# Patient Record
Sex: Female | Born: 1950 | Race: White | Hispanic: No
Health system: Midwestern US, Community
[De-identification: ages and names within clinical notes are randomized; demographics above are authoritative.]

## PROBLEM LIST (undated history)

## (undated) DIAGNOSIS — N816 Rectocele: Principal | ICD-10-CM

## (undated) DIAGNOSIS — N8110 Cystocele, unspecified: Secondary | ICD-10-CM

## (undated) DIAGNOSIS — R413 Other amnesia: Secondary | ICD-10-CM

## (undated) DIAGNOSIS — Z13820 Encounter for screening for osteoporosis: Secondary | ICD-10-CM

## (undated) DIAGNOSIS — N959 Unspecified menopausal and perimenopausal disorder: Secondary | ICD-10-CM

## (undated) DIAGNOSIS — J322 Chronic ethmoidal sinusitis: Secondary | ICD-10-CM

## (undated) DIAGNOSIS — N814 Uterovaginal prolapse, unspecified: Principal | ICD-10-CM

## (undated) DIAGNOSIS — G4700 Insomnia, unspecified: Secondary | ICD-10-CM

## (undated) DIAGNOSIS — Z1159 Encounter for screening for other viral diseases: Secondary | ICD-10-CM

## (undated) DIAGNOSIS — B009 Herpesviral infection, unspecified: Secondary | ICD-10-CM

## (undated) DIAGNOSIS — I10 Essential (primary) hypertension: Secondary | ICD-10-CM

## (undated) DIAGNOSIS — E785 Hyperlipidemia, unspecified: Secondary | ICD-10-CM

## (undated) DIAGNOSIS — K219 Gastro-esophageal reflux disease without esophagitis: Secondary | ICD-10-CM

## (undated) DIAGNOSIS — E669 Obesity, unspecified: Secondary | ICD-10-CM

## (undated) HISTORY — PX: AUGMENTATION MAMMAPLASTY: SUR837

## (undated) HISTORY — DX: Essential (primary) hypertension: I10

## (undated) HISTORY — DX: Obesity, unspecified: E66.9

## (undated) HISTORY — DX: Hyperlipidemia, unspecified: E78.5

## (undated) HISTORY — DX: Gastro-esophageal reflux disease without esophagitis: K21.9

---

## 1985-10-02 HISTORY — PX: APPENDECTOMY: SHX54

## 1998-04-13 ENCOUNTER — Other Ambulatory Visit: Admission: RE | Admit: 1998-04-13 | Discharge: 1998-04-13 | Payer: Self-pay | Admitting: Gynecology

## 2000-04-11 ENCOUNTER — Other Ambulatory Visit: Admission: RE | Admit: 2000-04-11 | Discharge: 2000-04-11 | Payer: Self-pay | Admitting: Gynecology

## 2000-10-02 HISTORY — PX: FRACTURE SURGERY: SHX138

## 2001-05-24 ENCOUNTER — Other Ambulatory Visit: Admission: RE | Admit: 2001-05-24 | Discharge: 2001-05-24 | Payer: Self-pay | Admitting: Gynecology

## 2001-05-24 ENCOUNTER — Encounter: Admission: RE | Admit: 2001-05-24 | Discharge: 2001-05-24 | Payer: Self-pay | Admitting: Gynecology

## 2001-05-24 ENCOUNTER — Encounter: Payer: Self-pay | Admitting: Gynecology

## 2002-04-10 ENCOUNTER — Other Ambulatory Visit: Admission: RE | Admit: 2002-04-10 | Discharge: 2002-04-10 | Payer: Self-pay | Admitting: Gynecology

## 2005-01-06 ENCOUNTER — Other Ambulatory Visit: Admission: RE | Admit: 2005-01-06 | Discharge: 2005-01-06 | Payer: Self-pay | Admitting: Gynecology

## 2005-05-24 ENCOUNTER — Ambulatory Visit: Payer: Self-pay | Admitting: Internal Medicine

## 2006-01-09 ENCOUNTER — Other Ambulatory Visit: Admission: RE | Admit: 2006-01-09 | Discharge: 2006-01-09 | Payer: Self-pay | Admitting: Gynecology

## 2006-01-23 ENCOUNTER — Encounter: Admission: RE | Admit: 2006-01-23 | Discharge: 2006-01-23 | Payer: Self-pay | Admitting: Gynecology

## 2006-07-04 ENCOUNTER — Encounter: Admission: RE | Admit: 2006-07-04 | Discharge: 2006-07-04 | Payer: Self-pay | Admitting: Gynecology

## 2007-05-23 ENCOUNTER — Other Ambulatory Visit: Admission: RE | Admit: 2007-05-23 | Discharge: 2007-05-23 | Payer: Self-pay | Admitting: Gynecology

## 2007-06-06 ENCOUNTER — Encounter: Admission: RE | Admit: 2007-06-06 | Discharge: 2007-06-06 | Payer: Self-pay | Admitting: Gynecology

## 2007-06-12 ENCOUNTER — Encounter: Admission: RE | Admit: 2007-06-12 | Discharge: 2007-06-12 | Payer: Self-pay | Admitting: Gynecology

## 2007-06-12 DIAGNOSIS — E785 Hyperlipidemia, unspecified: Secondary | ICD-10-CM | POA: Insufficient documentation

## 2008-06-24 ENCOUNTER — Ambulatory Visit: Payer: Self-pay | Admitting: Family Medicine

## 2008-08-10 ENCOUNTER — Encounter: Admission: RE | Admit: 2008-08-10 | Discharge: 2008-08-10 | Payer: Self-pay | Admitting: Gynecology

## 2008-08-24 ENCOUNTER — Ambulatory Visit: Payer: Self-pay | Admitting: Family Medicine

## 2008-10-27 ENCOUNTER — Ambulatory Visit: Payer: Self-pay | Admitting: Family Medicine

## 2009-05-24 ENCOUNTER — Ambulatory Visit (HOSPITAL_BASED_OUTPATIENT_CLINIC_OR_DEPARTMENT_OTHER): Admission: RE | Admit: 2009-05-24 | Discharge: 2009-05-24 | Payer: Self-pay | Admitting: Gynecology

## 2009-05-24 ENCOUNTER — Encounter (INDEPENDENT_AMBULATORY_CARE_PROVIDER_SITE_OTHER): Payer: Self-pay | Admitting: Gynecology

## 2009-06-25 ENCOUNTER — Ambulatory Visit: Payer: Self-pay | Admitting: Family Medicine

## 2009-09-27 ENCOUNTER — Encounter: Admission: RE | Admit: 2009-09-27 | Discharge: 2009-09-27 | Payer: Self-pay | Admitting: Gynecology

## 2009-09-27 LAB — HM MAMMOGRAPHY: HM Mammogram: NEGATIVE

## 2010-08-19 ENCOUNTER — Ambulatory Visit: Payer: Self-pay | Admitting: Family Medicine

## 2010-10-19 ENCOUNTER — Ambulatory Visit (HOSPITAL_COMMUNITY)
Admission: RE | Admit: 2010-10-19 | Discharge: 2010-10-19 | Payer: Self-pay | Source: Home / Self Care | Attending: Cardiovascular Disease | Admitting: Cardiovascular Disease

## 2010-10-23 ENCOUNTER — Encounter: Payer: Self-pay | Admitting: Gynecology

## 2010-10-24 ENCOUNTER — Encounter: Payer: Self-pay | Admitting: Gynecology

## 2010-10-27 ENCOUNTER — Ambulatory Visit
Admission: RE | Admit: 2010-10-27 | Discharge: 2010-10-27 | Payer: Self-pay | Source: Home / Self Care | Attending: Family Medicine | Admitting: Family Medicine

## 2010-11-07 ENCOUNTER — Other Ambulatory Visit: Payer: Self-pay | Admitting: Gynecology

## 2010-11-07 DIAGNOSIS — Z1231 Encounter for screening mammogram for malignant neoplasm of breast: Secondary | ICD-10-CM

## 2010-11-14 ENCOUNTER — Ambulatory Visit: Payer: Self-pay

## 2010-11-15 ENCOUNTER — Other Ambulatory Visit: Payer: Self-pay | Admitting: Gynecology

## 2011-01-07 LAB — POCT HEMOGLOBIN-HEMACUE: Hemoglobin: 12.7 g/dL (ref 12.0–15.0)

## 2011-01-19 ENCOUNTER — Ambulatory Visit (INDEPENDENT_AMBULATORY_CARE_PROVIDER_SITE_OTHER): Payer: BC Managed Care – PPO | Admitting: Family Medicine

## 2011-01-19 DIAGNOSIS — H8110 Benign paroxysmal vertigo, unspecified ear: Secondary | ICD-10-CM

## 2011-02-14 NOTE — Op Note (Signed)
Stacey Irwin, Stacey Irwin               ACCOUNT NO.:  [id number]   MEDICAL RECORD NO.:  [id number]          PATIENT TYPE:  AMB   LOCATION:  [facility]                         FACILITY:  [facility]   PHYSICIAN:  Gretta Cool, M.D. DATE OF BIRTH:  December 08, 1950   DATE OF PROCEDURE:  05/24/2009  DATE OF DISCHARGE:                               OPERATIVE REPORT   PREOPERATIVE DIAGNOSIS:  1. 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  2. Complete septate uterus mullerian fusion abnormality.   PROCEDURE:  Hysteroscopy and partial resection of uterine septum, total  endometrial resection from both uterine horns plus VaporTrode ablation.   SURGEON:  Beather Arbour, M.D.   ANESTHESIA:  IV sedation and paracervical block.   DESCRIPTION OF PROCEDURE:  Under excellent anesthesia as above with the  patient prepped and draped in Allen stirrups, her cervix grasped was  grasped with single-tooth tenaculum and pulled down into view.  It is  then progressively anesthetized with lidocaine 1% for a paracervical  block.  After paracervical block, the entire endometrial cavity was  examined.  Complete septation of the uterus was noted with a very thick  wall septum.  The lower portion of the septum was resected a bit  transversely so as to enter the endometrial cavity on both sides.  There  were narrow openings into the endometrial cavity with difficulty with  visualization because of poor inflow and outflow of fluid into the  hysteroscope.  Once the cavities were entered, visualization was quite  adequate.  On the right cavity, there was a large amount of endometrial  tissue polypoid looking in nature.  In the left there was a uterine  synechiae that partially blocked the examination of the cavity.  There  was resected and then the entire endometrial cavity on the left side  resected as well.  VaporTrode electrode was then used to totally  eliminate any  surface endometrium that might be viable.  There were some  areas on the right where there appeared to be deep extension of  endometrium out into the myometrial tissue.  At the end of the  procedure, the cavity was felt to have been completely treated on both  sides and down the septum.  At this point the procedure was terminated  with minimal bleeding.  Toradol was given intravenously for postop  comfort.  At this point the patient returned to recovery room in  excellent condition.           ______________________________  Gretta Cool, M.D.     CWL/MEDQ  D:  05/24/2009  T:  05/24/2009  Job:  [id number]   cc:   Sharlot Gowda, M.D.  Fax: [phone]

## 2011-03-21 ENCOUNTER — Other Ambulatory Visit: Payer: Self-pay | Admitting: Family Medicine

## 2011-07-20 ENCOUNTER — Encounter: Payer: Self-pay | Admitting: Family Medicine

## 2011-08-27 ENCOUNTER — Other Ambulatory Visit: Payer: Self-pay | Admitting: Family Medicine

## 2011-09-18 ENCOUNTER — Encounter: Payer: BC Managed Care – PPO | Admitting: Family Medicine

## 2011-10-02 ENCOUNTER — Other Ambulatory Visit: Payer: Self-pay | Admitting: Family Medicine

## 2011-10-10 ENCOUNTER — Ambulatory Visit
Admission: RE | Admit: 2011-10-10 | Discharge: 2011-10-10 | Disposition: A | Discharge status: Home / Self Care | Payer: BC Managed Care – PPO | Source: Ambulatory Visit | Attending: Gynecology | Admitting: Gynecology

## 2011-10-10 ENCOUNTER — Encounter: Payer: Self-pay | Admitting: Family Medicine

## 2011-10-10 ENCOUNTER — Ambulatory Visit (INDEPENDENT_AMBULATORY_CARE_PROVIDER_SITE_OTHER): Payer: BC Managed Care – PPO | Admitting: Family Medicine

## 2011-10-10 VITALS — BP 128/80 | HR 57 | Ht 67.0 in | Wt 209.0 lb

## 2011-10-10 DIAGNOSIS — Z1231 Encounter for screening mammogram for malignant neoplasm of breast: Secondary | ICD-10-CM

## 2011-10-10 DIAGNOSIS — Z23 Encounter for immunization: Secondary | ICD-10-CM

## 2011-10-10 DIAGNOSIS — R319 Hematuria, unspecified: Secondary | ICD-10-CM

## 2011-10-10 DIAGNOSIS — E669 Obesity, unspecified: Secondary | ICD-10-CM | POA: Insufficient documentation

## 2011-10-10 DIAGNOSIS — Z0000 Encounter for general adult medical examination without abnormal findings: Secondary | ICD-10-CM

## 2011-10-10 DIAGNOSIS — R05 Cough: Secondary | ICD-10-CM

## 2011-10-10 DIAGNOSIS — E785 Hyperlipidemia, unspecified: Secondary | ICD-10-CM

## 2011-10-10 DIAGNOSIS — I10 Essential (primary) hypertension: Secondary | ICD-10-CM | POA: Insufficient documentation

## 2011-10-10 DIAGNOSIS — R059 Cough, unspecified: Secondary | ICD-10-CM

## 2011-10-10 DIAGNOSIS — Z8249 Family history of ischemic heart disease and other diseases of the circulatory system: Secondary | ICD-10-CM | POA: Insufficient documentation

## 2011-10-10 LAB — CBC WITH DIFFERENTIAL/PLATELET
Basophils Absolute: 0 10*3/uL (ref 0.0–0.1)
Basophils Relative: 1 % (ref 0–1)
Eosinophils Absolute: 0.3 10*3/uL (ref 0.0–0.7)
Eosinophils Relative: 4 % (ref 0–5)
HCT: 42.4 % (ref 36.0–46.0)
Hemoglobin: 13.8 g/dL (ref 12.0–15.0)
Lymphocytes Relative: 29 % (ref 12–46)
Lymphs Abs: 2 10*3/uL (ref 0.7–4.0)
MCH: 30.9 pg (ref 26.0–34.0)
MCHC: 32.5 g/dL (ref 30.0–36.0)
MCV: 95.1 fL (ref 78.0–100.0)
Monocytes Absolute: 0.6 10*3/uL (ref 0.1–1.0)
Monocytes Relative: 9 % (ref 3–12)
Neutro Abs: 4 10*3/uL (ref 1.7–7.7)
Neutrophils Relative %: 57 % (ref 43–77)
Platelets: 263 10*3/uL (ref 150–400)
RBC: 4.46 MIL/uL (ref 3.87–5.11)
RDW: 13.7 % (ref 11.5–15.5)
WBC: 7 10*3/uL (ref 4.0–10.5)

## 2011-10-10 LAB — COMPREHENSIVE METABOLIC PANEL
ALT: 18 U/L (ref 0–35)
AST: 20 U/L (ref 0–37)
Albumin: 4.6 g/dL (ref 3.5–5.2)
Alkaline Phosphatase: 51 U/L (ref 39–117)
BUN: 15 mg/dL (ref 6–23)
CO2: 25 mEq/L (ref 19–32)
Calcium: 9.5 mg/dL (ref 8.4–10.5)
Chloride: 104 mEq/L (ref 96–112)
Creat: 0.95 mg/dL (ref 0.50–1.10)
Glucose, Bld: 87 mg/dL (ref 70–99)
Potassium: 4.2 mEq/L (ref 3.5–5.3)
Sodium: 140 mEq/L (ref 135–145)
Total Bilirubin: 0.4 mg/dL (ref 0.3–1.2)
Total Protein: 7.1 g/dL (ref 6.0–8.3)

## 2011-10-10 LAB — LIPID PANEL
Cholesterol: 226 mg/dL — ABNORMAL HIGH (ref 0–200)
HDL: 58 mg/dL (ref >39)
LDL Cholesterol: 138 mg/dL — ABNORMAL HIGH (ref 0–99)
Total CHOL/HDL Ratio: 3.9 Ratio
Triglycerides: 150 mg/dL — ABNORMAL HIGH (ref <150)
VLDL: 30 mg/dL (ref 0–40)

## 2011-10-10 LAB — POCT URINALYSIS DIPSTICK
Bilirubin, UA: NEGATIVE
Blood, UA: 50
Glucose, UA: NEGATIVE
Ketones, UA: NEGATIVE
Nitrite, UA: NEGATIVE
Protein, UA: NEGATIVE
Spec Grav, UA: 1.02
Urobilinogen, UA: NEGATIVE
pH, UA: 6

## 2011-10-10 LAB — POCT INFLUENZA A/B
Influenza A, POC: NEGATIVE
Influenza B, POC: NEGATIVE

## 2011-10-10 MED ORDER — LISINOPRIL-HYDROCHLOROTHIAZIDE 20-12.5 MG PO TABS: 1.0000 | ORAL_TABLET | Freq: Every day | ORAL | Status: DC

## 2011-10-10 MED ORDER — SIMVASTATIN 20 MG PO TABS: 20.0000 mg | ORAL_TABLET | Freq: Every day | ORAL | Status: DC

## 2011-10-10 NOTE — Patient Instructions (Signed)
See if you can get away with stopping the pantoprazole

## 2011-10-10 NOTE — Progress Notes (Signed)
Subjective:    Patient ID: Stacey Irwin, female    DOB: 10-30-1950, 61 y.o.   MRN: [id number]  HPI She is here for complete examination. She does have a several day history of chest congestion, slight sore throat, malaise. She also has had some difficulty with nocturia and urgency but says she has no difficulty with urinating during the day. She has also noted some knee and hip discomfort. Presently she is on protonix but apparently not using this regularly. She does use Xanax once or twice per week to help with sleep. She is a family history of heart disease, her father died at age 42 of an MI. She has been evaluated in the past by cardiology Her youngest daughter is about to graduate and move away from home. Family and social history was reviewed. She does see her gynecologist regularly.   Review of Systems  Constitutional: Negative.   HENT: Negative.   Eyes: Negative.   Respiratory: Negative.   Cardiovascular: Negative.   Gastrointestinal: Negative.   Genitourinary: Negative.   Musculoskeletal: Negative.   Skin: Negative.   Neurological: Negative.   Psychiatric/Behavioral: Negative.        Objective:   Physical Exam BP 128/80  Pulse 57  Ht 5\' 7"  (1.702 m)  Wt 209 lb (94.802 kg)  BMI 32.73 kg/m2  General Appearance:    Alert, cooperative, no distress, appears stated age  Head:    Normocephalic, without obvious abnormality, atraumatic  Eyes:    PERRL, conjunctiva/corneas clear, EOM's intact, fundi    benign  Ears:    Normal TM's and external ear canals  Nose:   Nares normal, mucosa normal, no drainage or sinus   tenderness  Throat:   Lips, mucosa, and tongue normal; teeth and gums normal  Neck:   Supple, no lymphadenopathy;  thyroid:  no   enlargement/tenderness/nodules; no carotid   bruit or JVD  Back:    Spine nontender, no curvature, ROM normal, no CVA     tenderness  Lungs:     Clear to auscultation bilaterally without wheezes, rales or     ronchi; respirations  unlabored  Chest Wall:    No tenderness or deformity   Heart:    Regular rate and rhythm, S1 and S2 normal, no murmur, rub   or gallop  Breast Exam:    Deferred to GYN  Abdomen:     Soft, non-tender, nondistended, normoactive bowel sounds,    no masses, no hepatosplenomegaly  Genitalia:    Deferred to GYN     Extremities:   No clubbing, cyanosis or edema  Pulses:   2+ and symmetric all extremities  Skin:   Skin color, texture, turgor normal, no rashes or lesions  Lymph nodes:   Cervical, supraclavicular, and axillary nodes normal  Neurologic:   CNII-XII intact, normal strength, sensation and gait; reflexes 2+ and symmetric throughout          Psych:   Normal mood, affect, hygiene and grooming.    Urine microscopic was contaminated.       Assessment & Plan:   1. Cough  Flu vaccine greater than or equal to 3yo preservative free IM, HM COLONOSCOPY, POCT Influenza A/B  2. Family history of heart disease in female family member before age 20  CBC with Differential, Comprehensive metabolic panel, Lipid panel  3. Hyperlipidemia LDL goal < 100  Lipid panel  4. Obesity (BMI 30-39.9)    5. Hypertension  CBC with Differential, Comprehensive metabolic panel, Lipid panel  6. Routine general medical examination at a health care facility  CBC with Differential, Comprehensive metabolic panel, Lipid panel  7. Hematuria  POCT Urinalysis Dipstick   I will have her hold Protonix and only use it if she does note return of reflux type symptoms. She we'll probably see her gynecologist soon. Recommend she discuss the urinary symptoms with him. Continue present medications.

## 2012-02-09 ENCOUNTER — Other Ambulatory Visit: Payer: Self-pay | Admitting: Gynecology

## 2012-03-13 ENCOUNTER — Telehealth: Payer: Self-pay | Admitting: Family Medicine

## 2012-03-14 MED ORDER — PANTOPRAZOLE SODIUM 40 MG PO TBEC: 40.0000 mg | DELAYED_RELEASE_TABLET | Freq: Every day | ORAL | Status: DC

## 2012-03-14 NOTE — Telephone Encounter (Signed)
Protonix called in for reflux symptoms

## 2012-06-04 ENCOUNTER — Ambulatory Visit (INDEPENDENT_AMBULATORY_CARE_PROVIDER_SITE_OTHER): Payer: BC Managed Care – PPO | Admitting: Family Medicine

## 2012-06-04 ENCOUNTER — Encounter: Payer: Self-pay | Admitting: Family Medicine

## 2012-06-04 VITALS — BP 120/70 | HR 68 | Temp 97.6°F | Wt 205.0 lb

## 2012-06-04 DIAGNOSIS — H6690 Otitis media, unspecified, unspecified ear: Secondary | ICD-10-CM

## 2012-06-04 DIAGNOSIS — H6693 Otitis media, unspecified, bilateral: Secondary | ICD-10-CM

## 2012-06-04 DIAGNOSIS — J029 Acute pharyngitis, unspecified: Secondary | ICD-10-CM

## 2012-06-04 LAB — POCT RAPID STREP A (OFFICE): Rapid Strep A Screen: NEGATIVE

## 2012-06-04 MED ORDER — AZITHROMYCIN 500 MG PO TABS: 500.0000 mg | ORAL_TABLET | Freq: Every day | ORAL | Status: AC

## 2012-06-04 NOTE — Patient Instructions (Addendum)
If you're not totally back to normal after one week call me. Use Afrin nasal spray at night

## 2012-06-04 NOTE — Progress Notes (Signed)
  Subjective:    Patient ID: Stacey Irwin, female    DOB: 1951/01/09, 61 y.o.   MRN: [id number]  HPI She a 5 day history of sore throat,headache followed by bilateral earache ,fever cough,congestion,myalgias anorexia. She does not smoke and does not have allergies. She continues on other medications listed in the chart. She cannot take any time off from work due to her busy work load.   Review of Systems     Objective:   Physical Exam alert and in no distress. Tympanic membranes are both dull and vascular, canals are normal. Throat is clear. Tonsils are normal. Neck is supple without adenopathy or thyromegaly. Cardiac exam shows a regular sinus rhythm without murmurs or gallops. Lungs are clear to auscultation.        Assessment & Plan:   1. Sore throat  POCT rapid strep A  2. Bilateral otitis media  azithromycin (ZITHROMAX) 500 MG tablet  If you're not totally back to normal after one week call me. Use Afrin nasal spray at night

## 2012-08-06 ENCOUNTER — Encounter: Payer: Self-pay | Admitting: Internal Medicine

## 2012-08-06 LAB — HM COLONOSCOPY

## 2012-08-14 ENCOUNTER — Encounter: Payer: Self-pay | Admitting: Family Medicine

## 2012-10-09 ENCOUNTER — Other Ambulatory Visit: Payer: Self-pay | Admitting: Family Medicine

## 2012-10-09 DIAGNOSIS — Z1231 Encounter for screening mammogram for malignant neoplasm of breast: Secondary | ICD-10-CM

## 2012-10-09 DIAGNOSIS — Z9882 Breast implant status: Secondary | ICD-10-CM

## 2012-11-14 ENCOUNTER — Encounter: Payer: BC Managed Care – PPO | Admitting: Family Medicine

## 2012-11-15 ENCOUNTER — Encounter: Payer: BC Managed Care – PPO | Admitting: Family Medicine

## 2012-11-15 ENCOUNTER — Encounter: Payer: Self-pay | Admitting: Family Medicine

## 2012-11-15 ENCOUNTER — Ambulatory Visit (INDEPENDENT_AMBULATORY_CARE_PROVIDER_SITE_OTHER): Payer: BC Managed Care – PPO | Admitting: Family Medicine

## 2012-11-15 ENCOUNTER — Other Ambulatory Visit: Payer: Self-pay | Admitting: Family Medicine

## 2012-11-15 VITALS — BP 128/82 | HR 70 | Ht 65.0 in | Wt 205.0 lb

## 2012-11-15 DIAGNOSIS — I10 Essential (primary) hypertension: Secondary | ICD-10-CM

## 2012-11-15 DIAGNOSIS — E669 Obesity, unspecified: Secondary | ICD-10-CM

## 2012-11-15 DIAGNOSIS — Z0000 Encounter for general adult medical examination without abnormal findings: Secondary | ICD-10-CM

## 2012-11-15 DIAGNOSIS — Z79899 Other long term (current) drug therapy: Secondary | ICD-10-CM

## 2012-11-15 DIAGNOSIS — E785 Hyperlipidemia, unspecified: Secondary | ICD-10-CM

## 2012-11-15 LAB — CBC WITH DIFFERENTIAL/PLATELET
Basophils Absolute: 0.1 10*3/uL (ref 0.0–0.1)
Basophils Relative: 1 % (ref 0–1)
Eosinophils Absolute: 0.2 10*3/uL (ref 0.0–0.7)
Eosinophils Relative: 3 % (ref 0–5)
HCT: 38.5 % (ref 36.0–46.0)
Hemoglobin: 13.3 g/dL (ref 12.0–15.0)
Lymphocytes Relative: 36 % (ref 12–46)
Lymphs Abs: 2.1 10*3/uL (ref 0.7–4.0)
MCH: 31.1 pg (ref 26.0–34.0)
MCHC: 34.5 g/dL (ref 30.0–36.0)
MCV: 90.2 fL (ref 78.0–100.0)
Monocytes Absolute: 0.4 10*3/uL (ref 0.1–1.0)
Monocytes Relative: 7 % (ref 3–12)
Neutro Abs: 3.1 10*3/uL (ref 1.7–7.7)
Neutrophils Relative %: 53 % (ref 43–77)
Platelets: 263 10*3/uL (ref 150–400)
RBC: 4.27 MIL/uL (ref 3.87–5.11)
RDW: 13.3 % (ref 11.5–15.5)
WBC: 5.9 10*3/uL (ref 4.0–10.5)

## 2012-11-15 LAB — POCT URINALYSIS DIPSTICK
Bilirubin, UA: NEGATIVE
Blood, UA: NEGATIVE
Glucose, UA: NEGATIVE
Ketones, UA: NEGATIVE
Leukocytes, UA: NEGATIVE
Nitrite, UA: NEGATIVE
Protein, UA: NEGATIVE
Spec Grav, UA: 1.02
Urobilinogen, UA: NEGATIVE
pH, UA: 5

## 2012-11-15 LAB — COMPREHENSIVE METABOLIC PANEL
ALT: 19 U/L (ref 0–35)
AST: 20 U/L (ref 0–37)
Albumin: 4.7 g/dL (ref 3.5–5.2)
Alkaline Phosphatase: 45 U/L (ref 39–117)
BUN: 30 mg/dL — ABNORMAL HIGH (ref 6–23)
CO2: 24 mEq/L (ref 19–32)
Calcium: 9.5 mg/dL (ref 8.4–10.5)
Chloride: 105 mEq/L (ref 96–112)
Creat: 1.16 mg/dL — ABNORMAL HIGH (ref 0.50–1.10)
Glucose, Bld: 93 mg/dL (ref 70–99)
Potassium: 4.6 mEq/L (ref 3.5–5.3)
Sodium: 140 mEq/L (ref 135–145)
Total Bilirubin: 0.5 mg/dL (ref 0.3–1.2)
Total Protein: 7.1 g/dL (ref 6.0–8.3)

## 2012-11-15 LAB — LIPID PANEL
Cholesterol: 215 mg/dL — ABNORMAL HIGH (ref 0–200)
HDL: 62 mg/dL (ref >39)
LDL Cholesterol: 136 mg/dL — ABNORMAL HIGH (ref 0–99)
Total CHOL/HDL Ratio: 3.5 Ratio
Triglycerides: 84 mg/dL (ref <150)
VLDL: 17 mg/dL (ref 0–40)

## 2012-11-15 NOTE — Patient Instructions (Signed)

## 2012-11-15 NOTE — Progress Notes (Signed)
  Subjective:    Patient ID: Stacey Irwin, female    DOB: [birth year], 62 y.o.   MRN: [id number]  HPI She is here for a complete examination. Review of her record indicates she is up-to-date on her shots, has had a colonoscopy as well as Pap. She plans to have another mammogram. She has had difficulty with sleep and does occasionally use Xanax to help with sleep. She cites several fractures including work stress and home stress especially dealing with her.her who is now in college. She continues on medications listed in the chart. Otherwise she has no particular concerns or complaints.   Review of Systems  Constitutional: Negative.   HENT: Negative.   Eyes: Negative.   Respiratory: Negative.   Cardiovascular: Negative.   Gastrointestinal: Negative.   Genitourinary: Negative.   Musculoskeletal: Negative.   Skin: Negative.   Allergic/Immunologic: Negative.   Neurological: Negative.   Hematological: Negative.   Psychiatric/Behavioral: Negative.        Objective:   Physical Exam        Assessment & Plan:  Routine general medical examination at a health care facility  Hypertension - Plan: POCT Urinalysis Dipstick, CBC with Differential, Comprehensive metabolic panel  HYPERLIPIDEMIA - Plan: Lipid panel  Obesity (BMI 30-39.9)  Encounter for long-term (current) use of other medications - Plan: Lipid panel, CBC with Differential, Comprehensive metabolic panel information on sleep and sleep hygiene was given. I did say that I would give her a small prescription for Ambien but stressed the fact that I did not want her to be using it on a regular basis.

## 2012-11-18 ENCOUNTER — Other Ambulatory Visit: Payer: Self-pay

## 2012-11-18 MED ORDER — SIMVASTATIN 40 MG PO TABS: 40.0000 mg | ORAL_TABLET | Freq: Every day | ORAL | Status: DC

## 2012-11-18 NOTE — Telephone Encounter (Signed)
SENT IN 40 MG ZOCOR PER JCL

## 2012-11-18 NOTE — Progress Notes (Signed)
Quick Note:  Pt informed and verbalized understanding . ______ 

## 2013-02-11 ENCOUNTER — Ambulatory Visit: Payer: Self-pay | Admitting: Medical

## 2013-04-07 ENCOUNTER — Telehealth: Payer: Self-pay | Admitting: Internal Medicine

## 2013-04-07 NOTE — Telephone Encounter (Signed)
Pt states she is not sleeping and would like something called in send to [facility] on [address]. Phone # [phone]

## 2013-04-07 NOTE — Telephone Encounter (Signed)
Call in Ambien 5 mg,#30 with no refills. One each bedtime when necessary sleep

## 2013-04-08 ENCOUNTER — Other Ambulatory Visit: Payer: Self-pay

## 2013-04-08 MED ORDER — ZOLPIDEM TARTRATE 5 MG PO TABS: 5.0000 mg | ORAL_TABLET | Freq: Every evening | ORAL | Status: DC | PRN

## 2013-04-08 NOTE — Telephone Encounter (Signed)
CALLED IN AMBIEN PER JCL 

## 2013-05-20 ENCOUNTER — Telehealth: Payer: Self-pay | Admitting: Internal Medicine

## 2013-05-20 MED ORDER — ZOLPIDEM TARTRATE 5 MG PO TABS: 5.0000 mg | ORAL_TABLET | Freq: Every evening | ORAL | Status: DC | PRN

## 2013-05-20 NOTE — Telephone Encounter (Signed)
Okay to renew

## 2013-05-20 NOTE — Telephone Encounter (Signed)
CALLED IN REFILL ON AMBIEN PER JCL

## 2013-05-20 NOTE — Telephone Encounter (Signed)
Refill request for zolpidem tartrate 5mg  #30 to [facility]

## 2013-05-26 ENCOUNTER — Telehealth: Payer: Self-pay | Admitting: Internal Medicine

## 2013-05-26 MED ORDER — PANTOPRAZOLE SODIUM 40 MG PO TBEC: 40.0000 mg | DELAYED_RELEASE_TABLET | Freq: Every day | ORAL | Status: DC

## 2013-05-26 NOTE — Telephone Encounter (Signed)
Refill request for protonix 40mg  #90 to [facility] on [address]

## 2013-05-26 NOTE — Telephone Encounter (Signed)
Rx refill sent. CLS 

## 2013-07-02 ENCOUNTER — Other Ambulatory Visit: Payer: Self-pay | Admitting: Medical

## 2013-07-02 ENCOUNTER — Telehealth: Payer: Self-pay | Admitting: Family Medicine

## 2013-07-02 MED ORDER — ZOLPIDEM TARTRATE 5 MG PO TABS: 5.0000 mg | ORAL_TABLET | Freq: Every evening | ORAL | Status: DC | PRN

## 2013-07-02 NOTE — Progress Notes (Signed)
I called the medication to the pharmacy per DAVID Tysinger PA-C. CLS

## 2013-07-02 NOTE — Telephone Encounter (Signed)
Pt requesting refill on Zolpidem Tartrate 5 mg #30 to [facility]

## 2013-07-02 NOTE — Telephone Encounter (Signed)
Is this okay to refill? 

## 2013-08-07 ENCOUNTER — Other Ambulatory Visit: Payer: Self-pay

## 2013-08-15 ENCOUNTER — Telehealth: Payer: Self-pay | Admitting: Family Medicine

## 2013-08-15 ENCOUNTER — Other Ambulatory Visit: Payer: Self-pay

## 2013-08-15 NOTE — Telephone Encounter (Signed)
Call Stacey Irwin and let her know the I will need to see her before any more renewals since she is  using this on a monthly basis.

## 2013-08-15 NOTE — Telephone Encounter (Signed)
IS THIS OK 

## 2013-08-15 NOTE — Telephone Encounter (Signed)
Pt is requesting refill for Zolpidiem Tartate 5 mg #30 to [facility].

## 2013-08-18 NOTE — Telephone Encounter (Signed)
Left message word for word  

## 2013-08-21 ENCOUNTER — Ambulatory Visit (INDEPENDENT_AMBULATORY_CARE_PROVIDER_SITE_OTHER): Payer: BC Managed Care – PPO | Admitting: Family Medicine

## 2013-08-21 ENCOUNTER — Encounter: Payer: Self-pay | Admitting: Family Medicine

## 2013-08-21 VITALS — BP 120/70 | HR 59 | Wt 207.0 lb

## 2013-08-21 DIAGNOSIS — F5104 Psychophysiologic insomnia: Secondary | ICD-10-CM

## 2013-08-21 DIAGNOSIS — G4700 Insomnia, unspecified: Secondary | ICD-10-CM

## 2013-08-21 MED ORDER — ZOLPIDEM TARTRATE 5 MG PO TABS: 5.0000 mg | ORAL_TABLET | Freq: Every evening | ORAL | Status: DC | PRN

## 2013-08-21 NOTE — Progress Notes (Signed)
  Subjective:    Patient ID: Stacey Irwin, female    DOB: [birth year], 62 y.o.   MRN: [id number]  HPI She is here for consultation concerning continued difficulty with insomnia. She cites her stressful work conditions, taking care of her brother who is in assisted living. Another brother has moved in with her apparently short-term to help him get back on track after being addicted to cocaine. She also states she has aches and pains that occur then also entered from her sleep. She does state that Tylenol and an NSAID does help with the pain. She has not used her sleep meds on a daily basis.   Review of Systems     Objective:   Physical Exam Alert and in no distress otherwise not examined       Assessment & Plan:  Chronic insomnia  I attempted to convey to her that sleeping is a learned behavior although she rejects that idea. Explained that this is not my idea about what the literature shows. Explained the need to work toward shutting her brain down at night rather than constantly thinking. Recommended that she right these things down prior to going to bed and her comment was I don't have time. Sleep hygiene information given. Did recommend that she take Tylenol before going to bed to help with the various aches and pains and see if that works to help with her sleep. Mentioned the possibility of counseling although she is not at all interested in that. I doubt very seriously she is willing or interested to put time and effort into changing how she handles her sleep issues.

## 2013-08-21 NOTE — Patient Instructions (Signed)

## 2013-09-11 ENCOUNTER — Other Ambulatory Visit: Payer: Self-pay | Admitting: Family Medicine

## 2013-09-11 ENCOUNTER — Ambulatory Visit
Admission: RE | Admit: 2013-09-11 | Discharge: 2013-09-11 | Disposition: A | Discharge status: Home / Self Care | Payer: BC Managed Care – PPO | Source: Ambulatory Visit | Attending: Family Medicine | Admitting: Family Medicine

## 2013-09-11 DIAGNOSIS — Z9882 Breast implant status: Secondary | ICD-10-CM

## 2013-09-11 DIAGNOSIS — Z1231 Encounter for screening mammogram for malignant neoplasm of breast: Secondary | ICD-10-CM

## 2013-11-19 ENCOUNTER — Encounter: Payer: BC Managed Care – PPO | Admitting: Family Medicine

## 2013-11-25 ENCOUNTER — Telehealth: Payer: Self-pay | Admitting: Family Medicine

## 2013-11-25 NOTE — Telephone Encounter (Signed)
Pt called and states she went to urgent care last night, because she has the flu.  The Tamiflu is making her vomit and wants to know what to do.  I advised pt to call back to urgent care since they prescribed and find out what they want her to do.  Since they saw her for this condition.  She said she would.

## 2013-11-27 ENCOUNTER — Encounter: Payer: BC Managed Care – PPO | Admitting: Family Medicine

## 2013-12-05 ENCOUNTER — Other Ambulatory Visit: Payer: Self-pay | Admitting: Family Medicine

## 2013-12-12 ENCOUNTER — Other Ambulatory Visit: Payer: Self-pay | Admitting: Family Medicine

## 2013-12-12 NOTE — Telephone Encounter (Signed)
Medication sent in. 

## 2013-12-15 ENCOUNTER — Ambulatory Visit (INDEPENDENT_AMBULATORY_CARE_PROVIDER_SITE_OTHER): Payer: BC Managed Care – PPO | Admitting: Family Medicine

## 2013-12-15 ENCOUNTER — Encounter: Payer: Self-pay | Admitting: Family Medicine

## 2013-12-15 VITALS — BP 122/72 | HR 60 | Ht 67.0 in | Wt 204.0 lb

## 2013-12-15 DIAGNOSIS — K219 Gastro-esophageal reflux disease without esophagitis: Secondary | ICD-10-CM

## 2013-12-15 DIAGNOSIS — Z86010 Personal history of colonic polyps: Secondary | ICD-10-CM

## 2013-12-15 DIAGNOSIS — I10 Essential (primary) hypertension: Secondary | ICD-10-CM

## 2013-12-15 DIAGNOSIS — Z8249 Family history of ischemic heart disease and other diseases of the circulatory system: Secondary | ICD-10-CM

## 2013-12-15 DIAGNOSIS — Z0000 Encounter for general adult medical examination without abnormal findings: Secondary | ICD-10-CM

## 2013-12-15 DIAGNOSIS — E785 Hyperlipidemia, unspecified: Secondary | ICD-10-CM

## 2013-12-15 DIAGNOSIS — E669 Obesity, unspecified: Secondary | ICD-10-CM

## 2013-12-15 LAB — CBC WITH DIFFERENTIAL/PLATELET
Basophils Absolute: 0.1 10*3/uL (ref 0.0–0.1)
Basophils Relative: 1 % (ref 0–1)
Eosinophils Absolute: 0.2 10*3/uL (ref 0.0–0.7)
Eosinophils Relative: 4 % (ref 0–5)
HCT: 36.8 % (ref 36.0–46.0)
Hemoglobin: 12.7 g/dL (ref 12.0–15.0)
Lymphocytes Relative: 28 % (ref 12–46)
Lymphs Abs: 1.7 10*3/uL (ref 0.7–4.0)
MCH: 30.5 pg (ref 26.0–34.0)
MCHC: 34.5 g/dL (ref 30.0–36.0)
MCV: 88.5 fL (ref 78.0–100.0)
Monocytes Absolute: 0.4 10*3/uL (ref 0.1–1.0)
Monocytes Relative: 7 % (ref 3–12)
Neutro Abs: 3.6 10*3/uL (ref 1.7–7.7)
Neutrophils Relative %: 60 % (ref 43–77)
Platelets: 278 10*3/uL (ref 150–400)
RBC: 4.16 MIL/uL (ref 3.87–5.11)
RDW: 13.4 % (ref 11.5–15.5)
WBC: 6 10*3/uL (ref 4.0–10.5)

## 2013-12-15 LAB — POCT URINALYSIS DIPSTICK
Bilirubin, UA: NEGATIVE
Glucose, UA: NEGATIVE
Ketones, UA: NEGATIVE
Leukocytes, UA: NEGATIVE
Nitrite, UA: NEGATIVE
Protein, UA: NEGATIVE
Spec Grav, UA: 1.015
Urobilinogen, UA: NEGATIVE
pH, UA: 5

## 2013-12-15 MED ORDER — ALPRAZOLAM 0.25 MG PO TABS: 0.2500 mg | ORAL_TABLET | Freq: Every evening | ORAL | Status: DC | PRN

## 2013-12-15 MED ORDER — LISINOPRIL-HYDROCHLOROTHIAZIDE 20-12.5 MG PO TABS: ORAL_TABLET | ORAL | Status: DC

## 2013-12-15 MED ORDER — PANTOPRAZOLE SODIUM 40 MG PO TBEC: 40.0000 mg | DELAYED_RELEASE_TABLET | Freq: Every day | ORAL | Status: DC

## 2013-12-15 NOTE — Progress Notes (Signed)
Subjective:    Patient ID: Stacey Irwin, female    DOB: [birth year], 63 y.o.   MRN: [id number]  HPI She is here for complete examination. She has weaned herself off of Ambien. She seems to be doing fairly well with this however she would like a refill on her Xanax. She will mainly use this for dental appointments and other stressful type situations. She continues on 5 days for reflux symptoms. She does state that the Zocor causes myalgias. She does have a history of adenomatous colonic polyps. Her last colonoscopy was 2013 and she will therefore need repeat in 2016. She has had a mammogram. Her next Pap will be due next year. She exercises regularly walking her dog and while she's at work. Her work is going well. Her personal life is unchanged although her daughter apparently will be moving to [place] in the near future.   Review of Systems     Objective:   Physical Exam BP 122/72  Pulse 60  Ht 5\' 7"  (1.702 m)  Wt 204 lb (92.534 kg)  BMI 31.94 kg/m2  General Appearance:    Alert, cooperative, no distress, appears stated age  Head:    Normocephalic, without obvious abnormality, atraumatic  Eyes:    PERRL, conjunctiva/corneas clear, EOM's intact, fundi    benign  Ears:    Normal TM's and external ear canals  Nose:   Nares normal, mucosa normal, no drainage or sinus   tenderness  Throat:   Lips, mucosa, and tongue normal; teeth and gums normal  Neck:   Supple, no lymphadenopathy;  thyroid:  no   enlargement/tenderness/nodules; no carotid   bruit or JVD  Back:    Spine nontender, no curvature, ROM normal, no CVA     tenderness  Lungs:     Clear to auscultation bilaterally without wheezes, rales or     ronchi; respirations unlabored  Chest Wall:    No tenderness or deformity   Heart:    Regular rate and rhythm, S1 and S2 normal, no murmur, rub   or gallop  Breast Exam:    Deferred to GYN  Abdomen:     Soft, non-tender, nondistended, normoactive bowel sounds,    no masses, no  hepatosplenomegaly  Genitalia:    Deferred to GYN     Extremities:   No clubbing, cyanosis or edema  Pulses:   2+ and symmetric all extremities  Skin:   Skin color, texture, turgor normal, no rashes or lesions  Lymph nodes:   Cervical, supraclavicular, and axillary nodes normal  Neurologic:   CNII-XII intact, normal strength, sensation and gait; reflexes 2+ and symmetric throughout          Psych:   Normal mood, affect, hygiene and grooming.          Assessment & Plan:  Routine general medical examination at a health care facility - Plan: Urinalysis Dipstick, ALPRAZolam (XANAX) 0.25 MG tablet, CBC with Differential, Comprehensive metabolic panel, Lipid panel  Personal history of colonic polyps  Family history of heart disease in female family member before age 21 - Plan: Lipid panel  HYPERLIPIDEMIA - Plan: Lipid panel  Hypertension - Plan: CBC with Differential, Comprehensive metabolic panel, lisinopril-hydrochlorothiazide (PRINZIDE,ZESTORETIC) 20-12.5 MG per tablet  Obesity (BMI 30-39.9) - Plan: CBC with Differential, Comprehensive metabolic panel, Lipid panel  GERD (gastroesophageal reflux disease) - Plan: pantoprazole (PROTONIX) 40 MG tablet A sample of Crestor 5 mg was given. She is to stop taking her Zocor for 2 days then  switch to Crestor. If this works, I will call this in. She is to continue on her other medications.

## 2013-12-16 LAB — COMPREHENSIVE METABOLIC PANEL
ALT: 16 U/L (ref 0–35)
AST: 17 U/L (ref 0–37)
Albumin: 4.1 g/dL (ref 3.5–5.2)
Alkaline Phosphatase: 47 U/L (ref 39–117)
BUN: 12 mg/dL (ref 6–23)
CO2: 24 mEq/L (ref 19–32)
Calcium: 9.3 mg/dL (ref 8.4–10.5)
Chloride: 107 mEq/L (ref 96–112)
Creat: 0.89 mg/dL (ref 0.50–1.10)
Glucose, Bld: 87 mg/dL (ref 70–99)
Potassium: 3.9 mEq/L (ref 3.5–5.3)
Sodium: 140 mEq/L (ref 135–145)
Total Bilirubin: 0.6 mg/dL (ref 0.2–1.2)
Total Protein: 6.4 g/dL (ref 6.0–8.3)

## 2013-12-16 LAB — LIPID PANEL
Cholesterol: 247 mg/dL — ABNORMAL HIGH (ref 0–200)
HDL: 51 mg/dL (ref >39)
LDL Cholesterol: 176 mg/dL — ABNORMAL HIGH (ref 0–99)
Total CHOL/HDL Ratio: 4.8 Ratio
Triglycerides: 98 mg/dL (ref <150)
VLDL: 20 mg/dL (ref 0–40)

## 2014-03-10 ENCOUNTER — Other Ambulatory Visit: Payer: Self-pay

## 2014-03-10 DIAGNOSIS — Z0000 Encounter for general adult medical examination without abnormal findings: Secondary | ICD-10-CM

## 2014-03-10 MED ORDER — ALPRAZOLAM 0.25 MG PO TABS: 0.2500 mg | ORAL_TABLET | Freq: Every evening | ORAL | Status: DC | PRN

## 2014-12-26 ENCOUNTER — Other Ambulatory Visit: Payer: Self-pay | Admitting: Family Medicine

## 2015-03-31 ENCOUNTER — Ambulatory Visit (INDEPENDENT_AMBULATORY_CARE_PROVIDER_SITE_OTHER): Payer: BLUE CROSS/BLUE SHIELD | Admitting: Family Medicine

## 2015-03-31 ENCOUNTER — Encounter: Payer: Self-pay | Admitting: Family Medicine

## 2015-03-31 ENCOUNTER — Telehealth: Payer: Self-pay | Admitting: Family Medicine

## 2015-03-31 VITALS — BP 114/62 | HR 60 | Ht 67.0 in | Wt 206.0 lb

## 2015-03-31 DIAGNOSIS — Z6379 Other stressful life events affecting family and household: Secondary | ICD-10-CM | POA: Diagnosis not present

## 2015-03-31 DIAGNOSIS — I10 Essential (primary) hypertension: Secondary | ICD-10-CM

## 2015-03-31 DIAGNOSIS — E669 Obesity, unspecified: Secondary | ICD-10-CM

## 2015-03-31 DIAGNOSIS — Z8249 Family history of ischemic heart disease and other diseases of the circulatory system: Secondary | ICD-10-CM | POA: Diagnosis not present

## 2015-03-31 DIAGNOSIS — Z23 Encounter for immunization: Secondary | ICD-10-CM | POA: Diagnosis not present

## 2015-03-31 DIAGNOSIS — K219 Gastro-esophageal reflux disease without esophagitis: Secondary | ICD-10-CM

## 2015-03-31 DIAGNOSIS — Z0000 Encounter for general adult medical examination without abnormal findings: Secondary | ICD-10-CM | POA: Diagnosis not present

## 2015-03-31 DIAGNOSIS — E785 Hyperlipidemia, unspecified: Secondary | ICD-10-CM

## 2015-03-31 LAB — CBC WITH DIFFERENTIAL/PLATELET
Basophils Absolute: 0.1 10*3/uL (ref 0.0–0.1)
Basophils Relative: 1 % (ref 0–1)
Eosinophils Absolute: 0.2 10*3/uL (ref 0.0–0.7)
Eosinophils Relative: 3 % (ref 0–5)
HCT: 39.6 % (ref 36.0–46.0)
Hemoglobin: 13.1 g/dL (ref 12.0–15.0)
Lymphocytes Relative: 28 % (ref 12–46)
Lymphs Abs: 1.8 10*3/uL (ref 0.7–4.0)
MCH: 31 pg (ref 26.0–34.0)
MCHC: 33.1 g/dL (ref 30.0–36.0)
MCV: 93.6 fL (ref 78.0–100.0)
MPV: 10.4 fL (ref 8.6–12.4)
Monocytes Absolute: 0.4 10*3/uL (ref 0.1–1.0)
Monocytes Relative: 7 % (ref 3–12)
Neutro Abs: 3.9 10*3/uL (ref 1.7–7.7)
Neutrophils Relative %: 61 % (ref 43–77)
Platelets: 275 10*3/uL (ref 150–400)
RBC: 4.23 MIL/uL (ref 3.87–5.11)
RDW: 13.2 % (ref 11.5–15.5)
WBC: 6.4 10*3/uL (ref 4.0–10.5)

## 2015-03-31 LAB — COMPREHENSIVE METABOLIC PANEL
ALT: 13 U/L (ref 0–35)
AST: 17 U/L (ref 0–37)
Albumin: 4.2 g/dL (ref 3.5–5.2)
Alkaline Phosphatase: 48 U/L (ref 39–117)
BUN: 16 mg/dL (ref 6–23)
CO2: 25 mEq/L (ref 19–32)
Calcium: 9.4 mg/dL (ref 8.4–10.5)
Chloride: 101 mEq/L (ref 96–112)
Creat: 1.13 mg/dL — ABNORMAL HIGH (ref 0.50–1.10)
Glucose, Bld: 93 mg/dL (ref 70–99)
Potassium: 3.9 mEq/L (ref 3.5–5.3)
Sodium: 140 mEq/L (ref 135–145)
Total Bilirubin: 0.4 mg/dL (ref 0.2–1.2)
Total Protein: 6.8 g/dL (ref 6.0–8.3)

## 2015-03-31 LAB — POCT URINALYSIS DIPSTICK
Bilirubin, UA: NEGATIVE
Blood, UA: POSITIVE
Glucose, UA: NEGATIVE
Ketones, UA: NEGATIVE
Nitrite, UA: NEGATIVE
Protein, UA: NEGATIVE
Spec Grav, UA: >=1.03
Urobilinogen, UA: NEGATIVE
pH, UA: 6

## 2015-03-31 LAB — LIPID PANEL
Cholesterol: 243 mg/dL — ABNORMAL HIGH (ref 0–200)
HDL: 59 mg/dL (ref >=46)
LDL Cholesterol: 164 mg/dL — ABNORMAL HIGH (ref 0–99)
Total CHOL/HDL Ratio: 4.1 Ratio
Triglycerides: 99 mg/dL (ref <150)
VLDL: 20 mg/dL (ref 0–40)

## 2015-03-31 MED ORDER — ATORVASTATIN CALCIUM 20 MG PO TABS: 20.0000 mg | ORAL_TABLET | Freq: Every day | ORAL | Status: DC

## 2015-03-31 MED ORDER — PANTOPRAZOLE SODIUM 40 MG PO TBEC: 40.0000 mg | DELAYED_RELEASE_TABLET | Freq: Every day | ORAL | Status: DC

## 2015-03-31 MED ORDER — ALPRAZOLAM 0.25 MG PO TABS: 0.2500 mg | ORAL_TABLET | Freq: Every evening | ORAL | Status: DC | PRN

## 2015-03-31 MED ORDER — LISINOPRIL-HYDROCHLOROTHIAZIDE 20-12.5 MG PO TABS: 1.0000 | ORAL_TABLET | Freq: Every day | ORAL | Status: DC

## 2015-03-31 NOTE — Telephone Encounter (Signed)
Left message for pt, need 2 month f/up & ck lipids per [employer]

## 2015-03-31 NOTE — Progress Notes (Signed)
Subjective:    Patient ID: Stacey GrillsCeleste Eber, female    DOB: 08/31/1951, 64 y.o.   MRN: [id number]  HPI She is here for complete examination. She is under more stress than usual dealing with the impending death of her brother from liver disease. He now is in hospice. She became quite tearful with this. She does however plan on taking a vacation with her other family members. She also plans to join a gym after his death. She does recognize the need to take better care of herself.She has had difficulty with her Zocor causing her to feel bad. She unfortunately forgot to tell this on many previous visits. She does have reflux symptoms but usually only takes medication once per week. He continues on her blood pressure medication. She is having no frequency, urgency or dysuria.Her work keeps her busy. Family and social history as well as health maintenance and immunizations were reviewed.   Review of Systems  All other systems reviewed and are negative.      Objective:   Physical Exam BP 114/62 mmHg  Pulse 60  Ht 5\' 7"  (1.702 m)  Wt 206 lb (93.441 kg)  BMI 32.26 kg/m2  General Appearance:    Alert, cooperative, no distress, appears stated age  Head:    Normocephalic, without obvious abnormality, atraumatic  Eyes:    PERRL, conjunctiva/corneas clear, EOM's intact, fundi    benign  Ears:    Normal TM's and external ear canals  Nose:   Nares normal, mucosa normal, no drainage or sinus   tenderness  Throat:   Lips, mucosa, and tongue normal; teeth and gums normal  Neck:   Supple, no lymphadenopathy;  thyroid:  no   enlargement/tenderness/nodules; no carotid   bruit or JVD  Back:    Spine nontender, no curvature, ROM normal, no CVA     tenderness  Lungs:     Clear to auscultation bilaterally without wheezes, rales or     ronchi; respirations unlabored  Chest Wall:    No tenderness or deformity   Heart:    Regular rate and rhythm, S1 and S2 normal, no murmur, rub   or gallop  Breast Exam:     Deferred to GYN  Abdomen:     Soft, non-tender, nondistended, normoactive bowel sounds,    no masses, no hepatosplenomegaly  Genitalia:    Deferred to GYN     Extremities:   No clubbing, cyanosis or edema  Pulses:   2+ and symmetric all extremities  Skin:   Skin color, texture, turgor normal, no rashes or lesions  Lymph nodes:   Cervical, supraclavicular, and axillary nodes normal  Neurologic:   CNII-XII intact, normal strength, sensation and gait; reflexes 2+ and symmetric throughout          Psych:   Normal mood, affect, hygiene and grooming.         Assessment & Plan:  Routine general medical examination at a health care facility - Plan: POCT Urinalysis Dipstick  Hyperlipidemia LDL goal <130 - Plan: atorvastatin (LIPITOR) 20 MG tablet, Lipid panel  Obesity (BMI 30-39.9) - Plan: CBC with Differential/Platelet, Comprehensive metabolic panel, Lipid panel  Essential hypertension - Plan: CBC with Differential/Platelet, Comprehensive metabolic panel, lisinopril-hydrochlorothiazide (PRINZIDE,ZESTORETIC) 20-12.5 MG per tablet  Family history of heart disease in female family member before age 65  Stress due to illness of family member - Plan: ALPRAZolam (XANAX) 0.25 MG tablet  Need for prophylactic vaccination against Streptococcus pneumoniae (pneumococcus) - Plan: Pneumococcal conjugate vaccine  13-valent  Gastroesophageal reflux disease without esophagitis - Plan: pantoprazole (PROTONIX) 40 MG tablet I encouraged her to keep in touch with hospice now and after his death. Prescription for Xanax was called in Encouraged her to take time out of her busy day for herself and encouraged her to to join the gym. She is not having any UTI symptoms and I will therefore not treat her positive urine test. She will continue on her PPI on an as-needed basis.She will be switched to Lipitor.She will be scheduled for an appointment in 2 months.

## 2015-03-31 NOTE — Patient Instructions (Signed)
Call hospice. Take some time out of your day for yourself even if it's 10 minutes

## 2015-04-01 ENCOUNTER — Other Ambulatory Visit: Payer: Self-pay

## 2015-04-01 MED ORDER — ATORVASTATIN CALCIUM 40 MG PO TABS: 40.0000 mg | ORAL_TABLET | Freq: Every day | ORAL | Status: DC

## 2015-04-01 NOTE — Addendum Note (Signed)
Addended by: Ronnald NianLALONDE, JOHN C on: 04/01/2015 08:32 AM   Modules accepted: Orders

## 2015-05-13 ENCOUNTER — Encounter: Payer: Self-pay | Admitting: Family Medicine

## 2015-05-13 ENCOUNTER — Ambulatory Visit (INDEPENDENT_AMBULATORY_CARE_PROVIDER_SITE_OTHER): Payer: BLUE CROSS/BLUE SHIELD | Admitting: Family Medicine

## 2015-05-13 ENCOUNTER — Ambulatory Visit: Payer: BLUE CROSS/BLUE SHIELD | Admitting: Family Medicine

## 2015-05-13 VITALS — BP 104/60 | HR 68 | Temp 98.0°F | Ht 67.0 in | Wt 202.8 lb

## 2015-05-13 DIAGNOSIS — R109 Unspecified abdominal pain: Secondary | ICD-10-CM

## 2015-05-13 DIAGNOSIS — K529 Noninfective gastroenteritis and colitis, unspecified: Secondary | ICD-10-CM | POA: Diagnosis not present

## 2015-05-13 LAB — POCT URINALYSIS DIPSTICK
Bilirubin, UA: NEGATIVE
Glucose, UA: NEGATIVE
Ketones, UA: NEGATIVE
Leukocytes, UA: NEGATIVE
Nitrite, UA: NEGATIVE
Protein, UA: NEGATIVE
Spec Grav, UA: 1.02
Urobilinogen, UA: NEGATIVE
pH, UA: 5.5

## 2015-05-13 NOTE — Progress Notes (Signed)
Chief Complaint  Patient presents with  . Diarrhea    had pasta with shrimp at 1pm yesterday. Around 4pm began having diarrhea-had it until 3am -going abut 12 times per hour. Also was belching like she had never belched before and the smell was bad, very bad. Had some pain in her chest because of this. Took a pantoprazole and a xanax and did get some rest from 3am-6am. 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    Patient presents with complaint of multiple episodes of diarrhea since yesterday.  Yesterday at 1pm she had pasta with shrimp.  She reports that the food tasted fine, but she felt a little "full", didn't finish the meal.  3 hours later she started with diarrhea.  She had a normal BM earlier that morning.  Stools was loose at first, but then became completely watery.  No blood or mucus.  She had belching, pain in her chest and along her flanks, with a lot of gurgling noises.  The chest discomfort was eventually relieved by belching (foul smeling), but it took a while to improve. At 9pm she took imodium, took 2 capsules.  Kept having diarrhea until 2:45am. She took pantoprazole, showered, took 1/2 xanax and went to bed.  Phone rang at 6, and once she was awake, she had to go to bathroom, and had watery stool.  4-5 episodes today. She drank gingerale.  She drank some juice, toast with melted cheese today. She is feeling better this afternoon, since stomach has calmed down. She has been having abdominal cramping just prior to diarrheal episodes.  Currently denies any abdominal pain.    No recent antibiotics, travel/camping.  No sick contacts.  No undercooked foods.  PMH, PSH, SH reviewed.  Outpatient Encounter Prescriptions as of 05/13/2015  Medication Sig Note  . ALPRAZolam (XANAX) 0.25 MG tablet Take 1 tablet (0.25 mg total) by mouth at bedtime as needed.   Marland Kitchen aspirin 81 MG tablet Take 81 mg by mouth daily.   Marland Kitchen  atorvastatin (LIPITOR) 40 MG tablet Take 1 tablet (40 mg total) by mouth daily.   . cholecalciferol (VITAMIN D) 1000 UNITS tablet Take 1,000 Units by mouth daily.     Marland Kitchen lisinopril-hydrochlorothiazide (PRINZIDE,ZESTORETIC) 20-12.5 MG per tablet Take 1 tablet by mouth daily.   . Multiple Vitamins-Minerals (MULTIVITAMIN WITH MINERALS) tablet Take 1 tablet by mouth daily.   . pantoprazole (PROTONIX) 40 MG tablet Take 1 tablet (40 mg total) by mouth daily.   . [DISCONTINUED] aspirin 81 MG tablet Take 81 mg by mouth daily.   . [DISCONTINUED] atorvastatin (LIPITOR) 20 MG tablet Take 20 mg by mouth daily. 05/13/2015: Received from: External Pharmacy   No facility-administered encounter medications on file as of 05/13/2015.   Imodium last night as per HPI. Takes probiotic daily, and has normal stool daily.  No Known Allergies  ROS:  No fever, chills, URI symptoms, cough, shortness of breath.  No exertional chest pain, and chest pressure she had yesterday has resolved. Denies nausea, vomiting, blood in stool or black stools.  Denies urinary complaints, bleeding, bruising, rash. No numbness, tingling, or other complaints.  PHYSICAL EXAM:  BP 104/60 mmHg  Pulse 68  Temp(Src) 98 F (36.7 C) (Tympanic)  Ht  (1.702 m)  Wt 202 lb 12.8 oz (91.989 kg)  BMI 31.76 kg/m2  Well developed, pleasant, well-appearing female in no distress HEENT: PERRL, EOMI, conjunctiva clear.  Mucus  membranes are moist Neck: no lymphadenopathy or mass Heart: regular rate and rhythm without murmur Lungs: clear bilaterally Abdomen: normal bowel sounds.  Soft, nontender Extremities: no edema, normal pulses Skin: normal turgor, no rashes Neuro: alert and oriented. Normal gait, strength, cranial nerves grossly intact.  ASSESSMENT/PLAN:  Abdominal pain, unspecified abdominal location - Plan: POCT Urinalysis Dipstick  Acute gastroenteritis  Counseled re: OTC meds, BRAT diet, lactose-free diet for 5 days. Counseled re:  s/sx for which she should seek additional medical treatment.  F/u prn

## 2015-05-13 NOTE — Patient Instructions (Addendum)

## 2015-05-14 ENCOUNTER — Encounter: Payer: Self-pay | Admitting: Family Medicine

## 2015-06-01 ENCOUNTER — Encounter: Payer: Self-pay | Admitting: Family Medicine

## 2015-06-01 ENCOUNTER — Other Ambulatory Visit: Payer: Self-pay

## 2015-06-01 ENCOUNTER — Ambulatory Visit (INDEPENDENT_AMBULATORY_CARE_PROVIDER_SITE_OTHER): Payer: BLUE CROSS/BLUE SHIELD | Admitting: Family Medicine

## 2015-06-01 VITALS — BP 122/72 | HR 54 | Ht 67.0 in | Wt 206.8 lb

## 2015-06-01 DIAGNOSIS — G479 Sleep disorder, unspecified: Secondary | ICD-10-CM | POA: Diagnosis not present

## 2015-06-01 DIAGNOSIS — E785 Hyperlipidemia, unspecified: Secondary | ICD-10-CM

## 2015-06-01 DIAGNOSIS — Z23 Encounter for immunization: Secondary | ICD-10-CM

## 2015-06-01 DIAGNOSIS — Z6379 Other stressful life events affecting family and household: Secondary | ICD-10-CM

## 2015-06-01 LAB — LIPID PANEL
Cholesterol: 168 mg/dL (ref 125–200)
HDL: 59 mg/dL (ref >=46)
LDL Cholesterol: 92 mg/dL (ref <130)
Total CHOL/HDL Ratio: 2.8 Ratio (ref <=5.0)
Triglycerides: 84 mg/dL (ref <150)
VLDL: 17 mg/dL (ref <30)

## 2015-06-01 MED ORDER — ALPRAZOLAM 0.25 MG PO TABS: 0.2500 mg | ORAL_TABLET | Freq: Every evening | ORAL | Status: DC | PRN

## 2015-06-01 MED ORDER — SUVOREXANT 10 MG PO TABS: 10.0000 mg | ORAL_TABLET | Freq: Every evening | ORAL | Status: DC | PRN

## 2015-06-01 NOTE — Progress Notes (Signed)
   Subjective:    Patient ID: Stacey Irwin, female    DOB: 05-May-1951, 64 y.o.   MRN: [id number]  HPI She is here for a recheck on her lipids. She was recently switched to Lipitor. She is having no difficulty with that. She needs to use Xanax mainly at night. She tends to fall asleep easily but will been wake up in the middle the night and take it. She is still under stress dealing with her brother who is in hospice.   Review of Systems     Objective:   Physical Exam Urgent and in no distress with appropriate affect       Assessment & Plan:  Hyperlipidemia LDL goal <130 - Plan: Lipid panel  Need for prophylactic vaccination and inoculation against influenza - Plan: Flu vaccine HIGH DOSE PF (Fluzone High dose)  Stress due to illness of family member - Plan: ALPRAZolam (XANAX) 0.25 MG tablet  Sleep disturbance - Plan: Suvorexant (BELSOMRA) 10 MG TABS Shot given with risks and benefits discussed. I will also renew her Xanax but encouraged her to not use it for sleep. Sample of Belsomra given and I will call in a prescription. Since she is having difficulty staying asleep this medication might be quite useful.

## 2015-06-01 NOTE — Telephone Encounter (Signed)
CALLED IN XANAX PER JCL 

## 2015-06-10 ENCOUNTER — Telehealth: Payer: Self-pay | Admitting: Family Medicine

## 2015-06-10 NOTE — Telephone Encounter (Signed)
Pt states Belsomra not at pharmacy, she has voucher but no Rx.  I called Belsomra into pharmacy & verified that she had not had it filled there before. Also advised pt that next prescription will probably require P.A.  She has tried and failed Ambien in past

## 2015-06-22 ENCOUNTER — Telehealth: Payer: Self-pay | Admitting: Family Medicine

## 2015-06-22 DIAGNOSIS — G479 Sleep disorder, unspecified: Secondary | ICD-10-CM

## 2015-06-22 MED ORDER — SUVOREXANT 10 MG PO TABS: 10.0000 mg | ORAL_TABLET | Freq: Every evening | ORAL | Status: DC | PRN

## 2015-06-22 NOTE — Telephone Encounter (Signed)
Requesting a script for Belsoma be sent to her pharmacy. Pt says Belsomra is helping her with her sleep issues. She tried the med for a few days then stopped the med and the sleep that she gets while taking the med is making a huge difference

## 2015-06-22 NOTE — Telephone Encounter (Signed)
Called and let pt know

## 2015-06-22 NOTE — Telephone Encounter (Signed)
I called the medication and let her know that it will probably require prior authorization which might take some time

## 2015-06-24 ENCOUNTER — Telehealth: Payer: Self-pay | Admitting: Family Medicine

## 2015-06-24 NOTE — Telephone Encounter (Signed)
Called [facility] s/w Mayme Genta & Belsomra went thru for $100 co pay and they have a discount card they will reprocess with discount card. Pt informed

## 2015-10-01 ENCOUNTER — Other Ambulatory Visit: Payer: Self-pay | Admitting: Family Medicine

## 2015-10-01 ENCOUNTER — Telehealth: Payer: Self-pay | Admitting: Family Medicine

## 2015-10-01 DIAGNOSIS — G479 Sleep disorder, unspecified: Secondary | ICD-10-CM

## 2015-10-01 MED ORDER — SUVOREXANT 10 MG PO TABS: 10.0000 mg | ORAL_TABLET | Freq: Every evening | ORAL | Status: DC | PRN

## 2015-10-01 NOTE — Telephone Encounter (Signed)
Requesting refill on Belsomra 10mg 

## 2015-10-03 LAB — HM PAP SMEAR: PAP Smear, External: NORMAL

## 2015-10-05 NOTE — Telephone Encounter (Signed)
Is this okay to call in? 

## 2015-10-06 ENCOUNTER — Other Ambulatory Visit: Payer: Self-pay | Admitting: *Deleted

## 2015-10-06 NOTE — Telephone Encounter (Signed)
Spoke with Pharmacist & on 12/31 2 refills were called in

## 2015-10-06 NOTE — Telephone Encounter (Signed)
Med was called in already on 12/31 and then re-called in today and med is ready to pick up for patient

## 2015-10-07 ENCOUNTER — Other Ambulatory Visit: Payer: Self-pay

## 2015-10-07 DIAGNOSIS — Z1231 Encounter for screening mammogram for malignant neoplasm of breast: Secondary | ICD-10-CM

## 2015-10-23 ENCOUNTER — Other Ambulatory Visit: Payer: Self-pay | Admitting: Family Medicine

## 2015-10-23 DIAGNOSIS — Z6379 Other stressful life events affecting family and household: Secondary | ICD-10-CM

## 2015-10-23 MED ORDER — ALPRAZOLAM 0.25 MG PO TABS: 0.2500 mg | ORAL_TABLET | Freq: Every evening | ORAL | Status: DC | PRN

## 2015-10-26 ENCOUNTER — Other Ambulatory Visit: Payer: Self-pay | Admitting: *Deleted

## 2015-10-26 ENCOUNTER — Telehealth: Payer: Self-pay | Admitting: Family Medicine

## 2015-10-26 DIAGNOSIS — I10 Essential (primary) hypertension: Secondary | ICD-10-CM

## 2015-10-26 MED ORDER — LISINOPRIL-HYDROCHLOROTHIAZIDE 20-12.5 MG PO TABS
1.0000 | ORAL_TABLET | Freq: Every day | ORAL | Status: DC
Start: 2015-10-26 — End: 2016-12-14

## 2015-10-26 MED ORDER — ATORVASTATIN CALCIUM 40 MG PO TABS: 40.0000 mg | ORAL_TABLET | Freq: Every day | ORAL | Status: DC

## 2015-10-26 NOTE — Telephone Encounter (Signed)
Sent refills to [facility]

## 2015-10-26 NOTE — Telephone Encounter (Signed)
Recv'd refill request from [facility], pt needs 90 days refills sent for Atorvastatin and Lisinopril/HCTZ She already has refills at local pharmacy but needs them sent to [facility]

## 2015-10-27 ENCOUNTER — Other Ambulatory Visit: Payer: Self-pay | Admitting: Family Medicine

## 2015-10-27 NOTE — Telephone Encounter (Signed)
Is ok to phone in?

## 2015-10-27 NOTE — Telephone Encounter (Signed)
I refill this on the 21st with one refill. It should have been called in so check if not go ahead and do it

## 2015-10-28 ENCOUNTER — Ambulatory Visit
Admission: RE | Admit: 2015-10-28 | Discharge: 2015-10-28 | Disposition: A | Discharge status: Home / Self Care | Payer: BLUE CROSS/BLUE SHIELD | Source: Ambulatory Visit

## 2015-10-28 DIAGNOSIS — Z1231 Encounter for screening mammogram for malignant neoplasm of breast: Secondary | ICD-10-CM

## 2015-10-28 NOTE — Telephone Encounter (Signed)
This was not called in back on 1/21. i have called med into pharmacy

## 2016-03-01 ENCOUNTER — Telehealth: Payer: Self-pay

## 2016-03-01 ENCOUNTER — Other Ambulatory Visit: Payer: Self-pay

## 2016-03-01 DIAGNOSIS — K219 Gastro-esophageal reflux disease without esophagitis: Secondary | ICD-10-CM

## 2016-03-01 MED ORDER — PANTOPRAZOLE SODIUM 40 MG PO TBEC: 40.0000 mg | DELAYED_RELEASE_TABLET | Freq: Every day | ORAL | Status: DC

## 2016-03-01 NOTE — Telephone Encounter (Signed)
Fax received for pt requesting 90 day supply of pantoprazole 40mg . Trixie Rude/RLB

## 2016-03-01 NOTE — Telephone Encounter (Signed)
Done

## 2016-03-21 ENCOUNTER — Encounter: Payer: Self-pay | Admitting: Family Medicine

## 2016-03-21 ENCOUNTER — Ambulatory Visit (INDEPENDENT_AMBULATORY_CARE_PROVIDER_SITE_OTHER): Payer: BLUE CROSS/BLUE SHIELD | Admitting: Family Medicine

## 2016-03-21 VITALS — BP 122/70 | HR 55 | Temp 97.6°F | Wt 203.6 lb

## 2016-03-21 DIAGNOSIS — J0100 Acute maxillary sinusitis, unspecified: Secondary | ICD-10-CM

## 2016-03-21 MED ORDER — AMOXICILLIN-POT CLAVULANATE 875-125 MG PO TABS: 1.0000 | ORAL_TABLET | Freq: Two times a day (BID) | ORAL | Status: DC

## 2016-03-21 NOTE — Progress Notes (Signed)
   Subjective:    Patient ID: Stacey GrillsCeleste Kratky, female    DOB: 01/20/1951, 65 y.o.   MRN: [id number]  HPI She has a five-day history it started with sore throat, sneezing and nasal congestion followed by frontal headache, upper tooth discomfort,. Postnasal drainage as well as productive cough. She does not smoke. She relates a history of having at least 2 months worth of difficulty with his congestion sneezing and rhinorrhea that occurs in the afternoons while at work. She has no problem on weekends as well as in the evenings. Fairly new carpeting was brought in and there was some black material found in the carpeting and when one of the pieces of carpet was pulled up there was a black mold-like material under it.   Review of Systems     Objective:   Physical Exam Alert and in no distress. Tympanic membranes and canals are normal. Pharyngeal area is normal. Neck is supple without adenopathy or thyromegaly. Cardiac exam shows a regular sinus rhythm without murmurs or gallops. Lungs are clear to auscultation. Nasal mucosa is red with tenderness over frontal and maxillary sinuses        Assessment & Plan:  Acute maxillary sinusitis, recurrence not specified - Plan: amoxicillin-clavulanate (AUGMENTIN) 875-125 MG tablet She will call if not entirely better when she finishes the antibiotic. Also encouraged her to inform the appropriate people concerning possible mold and the risk from this.

## 2016-05-29 ENCOUNTER — Other Ambulatory Visit: Payer: Self-pay | Admitting: Family Medicine

## 2016-05-29 NOTE — Telephone Encounter (Signed)
Is this okay to refill? 

## 2016-05-29 NOTE — Telephone Encounter (Signed)
Called med in per jcl 

## 2016-05-29 NOTE — Telephone Encounter (Signed)
Okay to renew

## 2016-08-21 ENCOUNTER — Other Ambulatory Visit (HOSPITAL_COMMUNITY)
Admission: RE | Admit: 2016-08-21 | Discharge: 2016-08-21 | Disposition: A | Discharge status: Home / Self Care | Payer: BLUE CROSS/BLUE SHIELD | Source: Ambulatory Visit | Attending: Family Medicine | Admitting: Family Medicine

## 2016-08-21 ENCOUNTER — Ambulatory Visit (INDEPENDENT_AMBULATORY_CARE_PROVIDER_SITE_OTHER): Payer: BLUE CROSS/BLUE SHIELD | Admitting: Family Medicine

## 2016-08-21 ENCOUNTER — Other Ambulatory Visit: Payer: Self-pay | Admitting: Family Medicine

## 2016-08-21 ENCOUNTER — Encounter: Payer: Self-pay | Admitting: Family Medicine

## 2016-08-21 VITALS — BP 110/56 | HR 61 | Ht 67.0 in | Wt 195.0 lb

## 2016-08-21 DIAGNOSIS — E669 Obesity, unspecified: Secondary | ICD-10-CM | POA: Diagnosis not present

## 2016-08-21 DIAGNOSIS — Z86010 Personal history of colonic polyps: Secondary | ICD-10-CM | POA: Diagnosis not present

## 2016-08-21 DIAGNOSIS — I10 Essential (primary) hypertension: Secondary | ICD-10-CM

## 2016-08-21 DIAGNOSIS — E785 Hyperlipidemia, unspecified: Secondary | ICD-10-CM | POA: Diagnosis not present

## 2016-08-21 DIAGNOSIS — Z124 Encounter for screening for malignant neoplasm of cervix: Secondary | ICD-10-CM

## 2016-08-21 DIAGNOSIS — Z0000 Encounter for general adult medical examination without abnormal findings: Secondary | ICD-10-CM | POA: Diagnosis not present

## 2016-08-21 DIAGNOSIS — Z23 Encounter for immunization: Secondary | ICD-10-CM

## 2016-08-21 DIAGNOSIS — Z8249 Family history of ischemic heart disease and other diseases of the circulatory system: Secondary | ICD-10-CM | POA: Diagnosis not present

## 2016-08-21 DIAGNOSIS — Z01419 Encounter for gynecological examination (general) (routine) without abnormal findings: Secondary | ICD-10-CM | POA: Diagnosis present

## 2016-08-21 DIAGNOSIS — Z1159 Encounter for screening for other viral diseases: Secondary | ICD-10-CM | POA: Diagnosis not present

## 2016-08-21 LAB — CBC WITH DIFFERENTIAL/PLATELET
Basophils Absolute: 81 cells/uL (ref 0–200)
Basophils Relative: 1 %
Eosinophils Absolute: 405 cells/uL (ref 15–500)
Eosinophils Relative: 5 %
HCT: 38.7 % (ref 35.0–45.0)
Hemoglobin: 12.7 g/dL (ref 11.7–15.5)
Lymphocytes Relative: 30 %
Lymphs Abs: 2430 cells/uL (ref 850–3900)
MCH: 31.4 pg (ref 27.0–33.0)
MCHC: 32.8 g/dL (ref 32.0–36.0)
MCV: 95.8 fL (ref 80.0–100.0)
MPV: 10.6 fL (ref 7.5–12.5)
Monocytes Absolute: 648 cells/uL (ref 200–950)
Monocytes Relative: 8 %
Neutro Abs: 4536 cells/uL (ref 1500–7800)
Neutrophils Relative %: 56 %
Platelets: 276 10*3/uL (ref 140–400)
RBC: 4.04 MIL/uL (ref 3.80–5.10)
RDW: 12.7 % (ref 11.0–15.0)
WBC: 8.1 10*3/uL (ref 4.0–10.5)

## 2016-08-21 LAB — LIPID PANEL
Cholesterol: 192 mg/dL (ref <200)
HDL: 70 mg/dL (ref >50)
LDL Cholesterol: 101 mg/dL — ABNORMAL HIGH (ref <100)
Total CHOL/HDL Ratio: 2.7 Ratio (ref <5.0)
Triglycerides: 104 mg/dL (ref <150)
VLDL: 21 mg/dL (ref <30)

## 2016-08-21 LAB — COMPREHENSIVE METABOLIC PANEL
ALT: 17 U/L (ref 6–29)
AST: 20 U/L (ref 10–35)
Albumin: 4.7 g/dL (ref 3.6–5.1)
Alkaline Phosphatase: 45 U/L (ref 33–130)
BUN: 19 mg/dL (ref 7–25)
CO2: 28 mmol/L (ref 20–31)
Calcium: 9.6 mg/dL (ref 8.6–10.4)
Chloride: 103 mmol/L (ref 98–110)
Creat: 1.11 mg/dL — ABNORMAL HIGH (ref 0.50–0.99)
Glucose, Bld: 105 mg/dL — ABNORMAL HIGH (ref 65–99)
Potassium: 3.6 mmol/L (ref 3.5–5.3)
Sodium: 139 mmol/L (ref 135–146)
Total Bilirubin: 0.4 mg/dL (ref 0.2–1.2)
Total Protein: 7.1 g/dL (ref 6.1–8.1)

## 2016-08-21 NOTE — Progress Notes (Signed)
Subjective:    Patient ID: Stacey Irwin, female    DOB: 02-15-1951, 65 y.o.   MRN: [id number]  HPI She is here for a complete examination. She is now involved in a weight loss program and has lost several pounds. Apparently they're giving her injections of amino acids and vitamin B12. She is happy with the progress that she is made. Psychologically she is doing quite well not taking anymore Xanax or  sleep meds. She relates this to her brother dying several months ago and no longer having to take care of him. She has a child graduated and is now off on her own. She feels quite liberated. She does have underlying reflux disease but rarely takes Protonix. Continues on atorvastatin and lisinopril. She is scheduled for colonoscopy to follow-up on her colonic polyp. He continues on atorvastatin. She did have a DEXA scan several years ago which was normal. Mammogram was done this year. She does need a Pap. She has no other concerns or complaints. Family and social history as well as health maintenance and immunizations were reviewed   Review of Systems  All other systems reviewed and are negative.      Objective:   Physical Exam BP (!) 110/56   Pulse 61   Ht 5\' 7"  (1.702 m)   Wt 195 lb (88.5 kg)   BMI 30.54 kg/m   General Appearance:    Alert, cooperative, no distress, appears stated age  Head:    Normocephalic, without obvious abnormality, atraumatic  Eyes:    PERRL, conjunctiva/corneas clear, EOM's intact, fundi    benign  Ears:    Normal TM's and external ear canals  Nose:   Nares normal, mucosa normal, no drainage or sinus   tenderness  Throat:   Lips, mucosa, and tongue normal; teeth and gums normal  Neck:   Supple, no lymphadenopathy;  thyroid:  no   enlargement/tenderness/nodules; no carotid   bruit or JVD     Lungs:     Clear to auscultation bilaterally without wheezes, rales or     ronchi; respirations unlabored      Heart:    Regular rate and rhythm, S1 and S2 normal, no  murmur, rub   or gallop     Abdomen:     Soft, non-tender, nondistended, normoactive bowel sounds,    no masses, no hepatosplenomegaly  Genitalia:    Normal external genitalia without lesions.  BUS and vagina normal; cervix without lesions, or cervical motion tenderness. No abnormal vaginal discharge.  Uterus and adnexa not enlarged, nontender, no masses.  Pap performed  Rectal:    Referred l  Extremities:   No clubbing, cyanosis or edema  Pulses:   2+ and symmetric all extremities  Skin:   Skin color, texture, turgor normal, no rashes or lesions  Lymph nodes:   Cervical, supraclavicular, and axillary nodes normal  Neurologic:   CNII-XII intact, normal strength, sensation and gait; reflexes 2+ and symmetric throughout          Psych:   Normal mood, affect, hygiene and grooming.         Assessment & Plan:  Routine general medical examination at a health care facility - Plan: CBC with Differential/Platelet, Comprehensive metabolic panel, Lipid panel  Hyperlipidemia LDL goal <130 - Plan: Lipid panel  Obesity (BMI 30-39.9) - Plan: CBC with Differential/Platelet, Comprehensive metabolic panel, Lipid panel  Essential hypertension - Plan: CBC with Differential/Platelet, Comprehensive metabolic panel  Family history of heart disease in female family  member before age 66 - Plan: CBC with Differential/Platelet, Comprehensive metabolic panel, Lipid panel  Need for prophylactic vaccination and inoculation against influenza - Plan: Flu vaccine HIGH DOSE PF (Fluzone High dose)  Need for prophylactic vaccination against Streptococcus pneumoniae (pneumococcus) - Plan: Pneumococcal polysaccharide vaccine 23-valent greater than or equal to 2yo subcutaneous/IM  Need for hepatitis C screening test - Plan: Hepatitis C antibody  Screening for cervical cancer - Plan: Cytology - PAP [place] In general things are going quite well for her right now. Encouraged her to continue with her diet and add more  exercise to her program. I will renew her meds when the labs come in and she can tell me which pharmacy to send 2.

## 2016-08-22 LAB — HEMOGLOBIN A1C
Hgb A1c MFr Bld: 5.1 % (ref <5.7)
Mean Plasma Glucose: 100 mg/dL

## 2016-08-22 LAB — HEPATITIS C ANTIBODY: HCV Ab: NEGATIVE

## 2016-08-23 LAB — CYTOLOGY - PAP: Diagnosis: NEGATIVE

## 2016-08-30 ENCOUNTER — Telehealth: Payer: Self-pay | Admitting: Family Medicine

## 2016-08-30 NOTE — Telephone Encounter (Signed)
[facility]

## 2016-08-31 NOTE — Telephone Encounter (Signed)
Pharmacy updated. Pt requesting refill of Alprazolam to [facility]

## 2016-08-31 NOTE — Telephone Encounter (Signed)
Dr. Jola BabinskiLalonde's recent physical notes specifically say Stacey Irwin is NOT taking xanax, so I would decline the refill

## 2016-09-05 ENCOUNTER — Other Ambulatory Visit: Payer: Self-pay

## 2016-09-05 DIAGNOSIS — K219 Gastro-esophageal reflux disease without esophagitis: Secondary | ICD-10-CM

## 2016-09-05 MED ORDER — PANTOPRAZOLE SODIUM 40 MG PO TBEC: 40.0000 mg | DELAYED_RELEASE_TABLET | Freq: Every day | ORAL | 0 refills | Status: DC

## 2016-09-05 MED ORDER — ALPRAZOLAM 0.25 MG PO TABS: ORAL_TABLET | ORAL | 0 refills | Status: DC

## 2016-09-05 NOTE — Telephone Encounter (Signed)
Call her and find out if she wants a refill

## 2016-09-05 NOTE — Telephone Encounter (Signed)
Pt states she did want them so [facility]

## 2016-09-05 NOTE — Telephone Encounter (Signed)
Called in xanax per jcl 

## 2016-09-05 NOTE — Telephone Encounter (Signed)
Another request rcvd in office via fax for refill of alprazolam. Please advise if refill appropriate. Stacey HewsShane noted that last your OV she was not taking alprazolam.

## 2016-10-05 ENCOUNTER — Encounter: Payer: Self-pay | Admitting: Family Medicine

## 2016-10-05 LAB — HM COLONOSCOPY

## 2016-11-15 ENCOUNTER — Encounter: Payer: Self-pay | Admitting: Family Medicine

## 2016-12-14 ENCOUNTER — Ambulatory Visit (INDEPENDENT_AMBULATORY_CARE_PROVIDER_SITE_OTHER): Payer: BLUE CROSS/BLUE SHIELD | Admitting: Family Medicine

## 2016-12-14 ENCOUNTER — Encounter: Payer: Self-pay | Admitting: Family Medicine

## 2016-12-14 VITALS — BP 126/82 | HR 60 | Wt 194.0 lb

## 2016-12-14 DIAGNOSIS — M545 Low back pain, unspecified: Secondary | ICD-10-CM

## 2016-12-14 DIAGNOSIS — A6000 Herpesviral infection of urogenital system, unspecified: Secondary | ICD-10-CM | POA: Diagnosis not present

## 2016-12-14 DIAGNOSIS — I10 Essential (primary) hypertension: Secondary | ICD-10-CM | POA: Diagnosis not present

## 2016-12-14 DIAGNOSIS — E785 Hyperlipidemia, unspecified: Secondary | ICD-10-CM | POA: Diagnosis not present

## 2016-12-14 LAB — POCT URINALYSIS DIPSTICK
Bilirubin, UA: NEGATIVE
Blood, UA: 1
Glucose, UA: NEGATIVE
Ketones, UA: NEGATIVE
Leukocytes, UA: NEGATIVE
Nitrite, UA: NEGATIVE
Protein, UA: NEGATIVE
Spec Grav, UA: 1.03 (ref 1.030–1.035)
Urobilinogen, UA: NEGATIVE (ref ?–2.0)
pH, UA: 5.5 (ref 5.0–8.0)

## 2016-12-14 MED ORDER — ATORVASTATIN CALCIUM 40 MG PO TABS: 40.0000 mg | ORAL_TABLET | Freq: Every day | ORAL | 2 refills | Status: DC

## 2016-12-14 MED ORDER — VALACYCLOVIR HCL 500 MG PO TABS: 500.0000 mg | ORAL_TABLET | Freq: Two times a day (BID) | ORAL | 5 refills | Status: DC

## 2016-12-14 MED ORDER — LISINOPRIL-HYDROCHLOROTHIAZIDE 20-12.5 MG PO TABS: 1.0000 | ORAL_TABLET | Freq: Every day | ORAL | 2 refills | Status: DC

## 2016-12-14 NOTE — Progress Notes (Signed)
   Subjective:    Patient ID: Leonides GrillsCeleste Fister, female    DOB: [birth year], 66 y.o.   MRN: [id number]  HPI She is here for evaluation of left-sided pain. The pain is made worse with physical activity. No dysuria frequency or urgency. She also needs her blood pressure medication renewed as she is switched to a different pharmacy. Also would like the Lipitor redone. She did have blood work done in November. She also has had a recent outbreak of herpes genitalis. She notes that she has 2 areas that she notes symptoms, one on the right buttock and this one also in the vaginal area very similar to previous episodes that she had several years ago. She would like her Valtrex renewed.   Review of Systems     Objective:   Physical Exam Her tendon no distress. Slight tenderness in the CVA area special with motion. Urine dipstick was positive however scopic was negative.       Assessment & Plan:  Hyperlipidemia LDL goal <130 - Plan: atorvastatin (LIPITOR) 40 MG tablet  Essential hypertension - Plan: lisinopril-hydrochlorothiazide (PRINZIDE,ZESTORETIC) 20-12.5 MG tablet  Genital herpes simplex, unspecified site  Acute left-sided low back pain without sciatica Recommend supportive care for the back pain since it seems to be musculoskeletal in nature. She will continue on her blood pressure medication and Lipitor and return here later this fall. Discussed the herpes genitalis especially since she hasn't had this in several years. Will treat her with Zovirax however she has recurrence, we might need to reevaluate the whole situation.

## 2016-12-14 NOTE — Patient Instructions (Signed)
Can take as many as 4 Advil 3 times per day also use heat for 20 minutes 3 times per day and gentle stretching after that

## 2017-02-21 ENCOUNTER — Emergency Department (HOSPITAL_COMMUNITY)
Admission: EM | Admit: 2017-02-21 | Discharge: 2017-02-22 | Disposition: A | Discharge status: Home / Self Care | Payer: BLUE CROSS/BLUE SHIELD | Attending: Emergency Medicine | Admitting: Emergency Medicine

## 2017-02-21 ENCOUNTER — Encounter (HOSPITAL_COMMUNITY): Payer: Self-pay

## 2017-02-21 DIAGNOSIS — Z7982 Long term (current) use of aspirin: Secondary | ICD-10-CM | POA: Diagnosis not present

## 2017-02-21 DIAGNOSIS — I10 Essential (primary) hypertension: Secondary | ICD-10-CM | POA: Diagnosis not present

## 2017-02-21 DIAGNOSIS — R1010 Upper abdominal pain, unspecified: Secondary | ICD-10-CM

## 2017-02-21 DIAGNOSIS — Z79899 Other long term (current) drug therapy: Secondary | ICD-10-CM | POA: Diagnosis not present

## 2017-02-21 NOTE — ED Provider Notes (Signed)
[facility]-EMERGENCY DEPT Provider Note   CSN: [id number] Arrival date & time: 02/21/17  2236  By signing my name below, I, Stacey Irwin, attest that this documentation has been prepared under the direction and in the presence of Stacey Irwin, Stacey Eichinger, MD. Electronically Signed: Karren CobbleNy'kea Irwin, ED Scribe. 02/22/17. 2:44 AM.  History   Chief Complaint Chief Complaint  Patient presents with  . Abdominal Pain   The history is provided by the patient. No language interpreter was used.    HPI Comments: Stacey Irwin is a 66 y.o. female with a PMHx of GERD, DLD, and HTN, who presents to the Emergency Department complaining of sudden onset, sharp, intermittent upper abdominal pain that started @ 1830. She notes associated nausea. She states she had a normal day and at dinner she consumed two glasses of wine. While sitting on her couch, she began to experience left upper quadrant abdominal pain that radiates to the right upper quadrant. She reports wearing spanks today and thought the pain was secondary to the compression, but notes no relief after removing them and lying down. Pantoprazole was tried with no improvement. In the past when she has experienced stomach aches the pain in normally in her lower quadrants.  She has never experienced this pain before. No alleviating factors. Denies fever, vomiting, dysuria, flatulence, or urinary frequency.   Past Medical History:  Diagnosis Date  . Dyslipidemia   . GERD (gastroesophageal reflux disease)   . Hypertension   . Obesity     Patient Active Problem List   Diagnosis Date Noted  . History of colonic polyps sessile serrated polyp/adenoma 08/21/2016  . Obesity (BMI 30-39.9) 10/10/2011  . Hypertension 10/10/2011  . Family history of heart disease in female family member before age 67 10/10/2011  . Hyperlipidemia LDL goal <130 06/12/2007    Past Surgical History:  Procedure Laterality Date  . ABDOMINAL HYSTERECTOMY  2010  . APPENDECTOMY  1987  . CESAREAN  SECTION  1995  . FRACTURE SURGERY  2002    OB History    No data available       Home Medications    Prior to Admission medications   Medication Sig Start Date End Date Taking? Authorizing Provider  ALPRAZolam Stacey Feeler(XANAX) 0.25 MG tablet TAKE 1 BY MOUTH AT BEDTIME AS NEEDED 09/05/16   Stacey Irwin, Stacey C, MD  aspirin 81 MG tablet Take 81 mg by mouth daily.    [provider]  atorvastatin (LIPITOR) 40 MG tablet Take 1 tablet (40 mg total) by mouth daily. 12/14/16   Stacey Irwin, Stacey C, MD  cholecalciferol (VITAMIN D) 1000 UNITS tablet Take 1,000 Units by mouth daily.      [provider]  lisinopril-hydrochlorothiazide (PRINZIDE,ZESTORETIC) 20-12.5 MG tablet Take 1 tablet by mouth daily. 12/14/16   Stacey Irwin, Stacey C, MD  magnesium 30 MG tablet Take 30 mg by mouth daily.    [provider]  Multiple Vitamins-Minerals (MULTIVITAMIN WITH MINERALS) tablet Take 1 tablet by mouth daily.    [provider]  pantoprazole (PROTONIX) 40 MG tablet Take 1 tablet (40 mg total) by mouth daily. 09/05/16   Stacey Irwin, Stacey C, MD  valACYclovir (VALTREX) 500 MG tablet Take 1 tablet (500 mg total) by mouth 2 (two) times daily. 12/14/16   Stacey Irwin, Stacey C, MD    Family History Family History  Problem Relation Age of Onset  . Stroke Mother   . Heart disease Mother [id number]       CVA  . Heart disease Father 4172  MI  . Diabetes Brother   . Heart disease Maternal Grandmother   . Heart disease Maternal Grandfather   . Emphysema Maternal Grandfather     Social History Social History  Substance Use Topics  . Smoking status: Never Smoker  . Smokeless tobacco: Never Used  . Alcohol use 0.0 oz/week     Comment: socially, 2-3/week     Allergies   Patient has no known allergies.   Review of Systems Review of Systems  Constitutional: Negative for fever.  Gastrointestinal: Positive for abdominal pain and nausea. Negative for vomiting.  Genitourinary: Negative for dysuria and  frequency.    A complete 10 system review of systems was obtained and all systems are negative except as noted in the HPI and PMH.   Physical Exam Updated Vital Signs BP (!) 202/88 (BP Location: Right Arm)   Pulse 67   Temp 97.6 F (36.4 Irwin) (Oral)   Resp 18   SpO2 98%   Physical Exam  Constitutional: She is oriented to person, place, and time. She appears well-developed and well-nourished. No distress.  HENT:  Head: Normocephalic and atraumatic.  Eyes: EOM are normal.  Neck: Normal range of motion.  Cardiovascular: Normal rate, regular rhythm and normal heart sounds.   Pulmonary/Chest: Effort normal and breath sounds normal.  Abdominal: Soft. She exhibits no distension. There is no tenderness.  Musculoskeletal: Normal range of motion.  Neurological: She is alert and oriented to person, place, and time.  Skin: Skin is warm and dry.  Psychiatric: She has a normal mood and affect. Judgment normal.  Nursing note and vitals reviewed.    ED Treatments / Results  DIAGNOSTIC STUDIES: Oxygen Saturation is 98% on RA, normal by my interpretation.   COORDINATION OF CARE: 12:06 AM-Discussed next steps with pt. Pt verbalized understanding and is agreeable with the plan.   Labs (all labs ordered are listed, but only abnormal results are displayed) Labs Reviewed  CBC - Abnormal; Notable for the following:       Result Value   WBC 12.5 (*)    All other components within normal limits  COMPREHENSIVE METABOLIC PANEL - Abnormal; Notable for the following:    Glucose, Bld 111 (*)    BUN 24 (*)    Creatinine, Ser 1.06 (*)    GFR calc non Af Amer 53 (*)    All other components within normal limits  URINALYSIS, ROUTINE W REFLEX MICROSCOPIC - Abnormal; Notable for the following:    Hgb urine dipstick MODERATE (*)    Leukocytes, UA SMALL (*)    Squamous Epithelial / LPF 0-5 (*)    All other components within normal limits  LIPASE, BLOOD    EKG  EKG Interpretation None        Radiology US Abdomen Limited Ruq  Result Date: 02/22/2017 CLINICAL DATA:  Upper abdominal pain. History of obesity, hypertension, gastroesophageal reflux disease. EXAM: US ABDOMEN LIMITED - RIGHT UPPER QUADRANT COMPARISON:  None. FINDINGS: Gallbladder: Limited visualization due to overlying bowel gas. Unable to adequately position patient for better evaluation. No stones, sludge, or gallbladder wall thickening identified. Murphy's sign is negative. Common bile duct: Diameter: 3.1 mm, normal Liver: Mild increased parenchymal echotexture suggesting fatty infiltration. No focal lesions identified. IMPRESSION: Limited examination but no evidence of cholelithiasis or cholecystitis. Electronically Signed   By: Burman Nieves M.D.   On: 02/22/2017 01:22    Procedures Procedures (including critical care time)  Medications Ordered in ED Medications - No data to display  Initial Impression / Assessment and Plan / ED Course  I have reviewed the triage vital signs and the nursing notes.  Pertinent labs & imaging results that were available during my care of the patient were reviewed by me and considered in my medical decision making (see chart for details).     Patient continues to feel well at this time.  She is feeling much better.  Workup in emergency department is without abnormality.  She's had no recurrence of her pain since being here.  Discharge home in good condition.  She understands to return to the ER for new or worsening symptoms  Final Clinical Impressions(s) / ED Diagnoses   Final diagnoses:  Upper abdominal pain    New Prescriptions New Prescriptions   No medications on file   I personally performed the services described in this documentation, which was scribed in my presence. The recorded information has been reviewed and is accurate.        Stacey Bilis, MD 02/22/17 [phone]

## 2017-02-21 NOTE — ED Triage Notes (Signed)
Pt complains of abdominal pain that radiates to the sides since about 1830, she states she is belching a lot and her antiacid provided no relief, pt is very nauseated

## 2017-02-22 ENCOUNTER — Emergency Department (HOSPITAL_COMMUNITY): Payer: BLUE CROSS/BLUE SHIELD

## 2017-02-22 LAB — LIPASE, BLOOD: Lipase: 26 U/L (ref 11–51)

## 2017-02-22 LAB — COMPREHENSIVE METABOLIC PANEL
ALT: 18 U/L (ref 14–54)
AST: 25 U/L (ref 15–41)
Albumin: 4 g/dL (ref 3.5–5.0)
Alkaline Phosphatase: 47 U/L (ref 38–126)
Anion gap: 9 (ref 5–15)
BUN: 24 mg/dL — ABNORMAL HIGH (ref 6–20)
CO2: 24 mmol/L (ref 22–32)
Calcium: 9.3 mg/dL (ref 8.9–10.3)
Chloride: 102 mmol/L (ref 101–111)
Creatinine, Ser: 1.06 mg/dL — ABNORMAL HIGH (ref 0.44–1.00)
GFR calc Af Amer: >60 mL/min (ref >60)
GFR calc non Af Amer: 53 mL/min — ABNORMAL LOW (ref >60)
Glucose, Bld: 111 mg/dL — ABNORMAL HIGH (ref 65–99)
Potassium: 3.6 mmol/L (ref 3.5–5.1)
Sodium: 135 mmol/L (ref 135–145)
Total Bilirubin: 0.4 mg/dL (ref 0.3–1.2)
Total Protein: 7 g/dL (ref 6.5–8.1)

## 2017-02-22 LAB — URINALYSIS, ROUTINE W REFLEX MICROSCOPIC
Bacteria, UA: NONE SEEN
Bilirubin Urine: NEGATIVE
Glucose, UA: NEGATIVE mg/dL
Ketones, ur: NEGATIVE mg/dL
Nitrite: NEGATIVE
Protein, ur: NEGATIVE mg/dL
Specific Gravity, Urine: 1.017 (ref 1.005–1.030)
pH: 6 (ref 5.0–8.0)

## 2017-02-22 LAB — CBC
HCT: 37.2 % (ref 36.0–46.0)
Hemoglobin: 12.9 g/dL (ref 12.0–15.0)
MCH: 32 pg (ref 26.0–34.0)
MCHC: 34.7 g/dL (ref 30.0–36.0)
MCV: 92.3 fL (ref 78.0–100.0)
Platelets: 237 10*3/uL (ref 150–400)
RBC: 4.03 MIL/uL (ref 3.87–5.11)
RDW: 13.5 % (ref 11.5–15.5)
WBC: 12.5 10*3/uL — ABNORMAL HIGH (ref 4.0–10.5)

## 2017-02-23 ENCOUNTER — Ambulatory Visit (INDEPENDENT_AMBULATORY_CARE_PROVIDER_SITE_OTHER): Payer: BLUE CROSS/BLUE SHIELD | Admitting: Family Medicine

## 2017-02-23 ENCOUNTER — Encounter: Payer: Self-pay | Admitting: Family Medicine

## 2017-02-23 VITALS — BP 110/62 | HR 61 | Temp 98.0°F | Wt 190.8 lb

## 2017-02-23 DIAGNOSIS — R10814 Left lower quadrant abdominal tenderness: Secondary | ICD-10-CM

## 2017-02-23 DIAGNOSIS — R1010 Upper abdominal pain, unspecified: Secondary | ICD-10-CM | POA: Diagnosis not present

## 2017-02-23 LAB — CBC WITH DIFFERENTIAL/PLATELET
Basophils Absolute: 70 cells/uL (ref 0–200)
Basophils Relative: 1 %
Eosinophils Absolute: 210 cells/uL (ref 15–500)
Eosinophils Relative: 3 %
HCT: 40.1 % (ref 35.0–45.0)
Hemoglobin: 13.3 g/dL (ref 11.7–15.5)
Lymphocytes Relative: 26 %
Lymphs Abs: 1820 cells/uL (ref 850–3900)
MCH: 31.2 pg (ref 27.0–33.0)
MCHC: 33.2 g/dL (ref 32.0–36.0)
MCV: 94.1 fL (ref 80.0–100.0)
MPV: 10.9 fL (ref 7.5–12.5)
Monocytes Absolute: 560 cells/uL (ref 200–950)
Monocytes Relative: 8 %
Neutro Abs: 4340 cells/uL (ref 1500–7800)
Neutrophils Relative %: 62 %
Platelets: 286 10*3/uL (ref 140–400)
RBC: 4.26 MIL/uL (ref 3.80–5.10)
RDW: 13.6 % (ref 11.0–15.0)
WBC: 7 10*3/uL (ref 4.0–10.5)

## 2017-02-23 NOTE — Progress Notes (Signed)
Subjective:    Patient ID: Stacey Irwin, female    DOB: [birth year], 66 y.o.   MRN: [id number]  HPI Chief Complaint  Patient presents with  . issues with gallbladder    went to ER the other night and Ultrasound did not show any stones or anything   She is here for a follow up after going to the ED with complaints of a 2 day history of intermittent upper abdominal pain that has been sharp and radiated across the top of her abdomen. She denies having pain today. Symptoms are progressively improving.  States pain started after 2 glasses of prosecco. States she occasionally drinks alcohol, does not drink daily.  At onset of abdominal pain she was wearing a very tight and restrictive piece of clothing that was causing her pain around the top of her abdomen. States she took the clothing off but the pain did not go away. Pain was initially worse with movement.  Food has also aggravated her symptoms.  RUQ US performed at the ED showed no gallbladder stones or sludge. Study was limited by bowel gas. Her symptoms improved while she was in the ED and she was discharged home without further intervention.   Colonoscopy is up to date. Showed a diverticulum and small polyp.   States she has also had some bloating and belching. Has had 2 bowel movements this morning and 3-4 movements yesterday. She takes magnesium to help with her bowel movements. Softer stool than usual but denies blood or pus.    States she had some nausea. No vomiting or diarrhea. Denies fever, chills, dizziness, chest pain, palpitations, shortness of breath, urinary or vaginal symptoms.   States she travels for work and is leaving for a trip out of state next week.  No recent travel out of the country.   Reviewed allergies, medications, past medical, surgical, family, and social history.   Review of Systems Pertinent positives and negatives in the history of present illness.     Objective:   Physical Exam  Constitutional: She  is oriented to person, place, and time. She appears well-developed and well-nourished. She does not have a sickly appearance. No distress.  HENT:  Mouth/Throat: Uvula is midline, oropharynx is clear and moist and mucous membranes are normal.  Eyes: Conjunctivae, EOM and lids are normal. Pupils are equal, round, and reactive to light. No scleral icterus.  Neck: Full passive range of motion without pain. Neck supple. No JVD present. No thyromegaly present.  Cardiovascular: Normal rate, regular rhythm, normal heart sounds and normal pulses.  Exam reveals no gallop and no friction rub.   No murmur heard. No LE edema.   Pulmonary/Chest: Effort normal and breath sounds normal.  Abdominal: Soft. Normal appearance and bowel sounds are normal. There is no hepatosplenomegaly. There is tenderness in the left lower quadrant. There is no rigidity, no rebound, no guarding, no CVA tenderness, no tenderness at McBurney's point and negative Murphy's sign.  She is able to do a full sit up without pain.   Lymphadenopathy:    She has no cervical adenopathy.       Right: No supraclavicular adenopathy present.       Left: No supraclavicular adenopathy present.  Neurological: She is alert and oriented to person, place, and time. She has normal strength. No cranial nerve deficit or sensory deficit. Gait normal.  Skin: Skin is warm and dry. No rash noted. No pallor.  Psychiatric: She has a normal mood and affect. Her speech is  normal and behavior is normal. Thought content normal.   BP 110/62   Pulse 61   Temp 98 F (36.7 C) (Oral)   Wt 190 lb 12.8 oz (86.5 kg)   BMI 29.88 kg/m       Assessment & Plan:  Upper abdominal pain - Plan: CBC with Differential/Platelet, Comprehensive metabolic panel, Lipase, Amylase  Left lower quadrant abdominal tenderness without rebound tenderness - Plan: CBC with Differential/Platelet, Comprehensive metabolic panel, Lipase, Amylase  Discussed possible etiologies including  early diverticulitis vs viral illness or MSK. She is improving and pain free today. She does not appear infectious. Offered to send her for a CT abdomen but she declines and would like to wait which I do think is reasonable. Will check labs and have her follow up if not being back to baseline next week.  If she worsens then she will go to the ED for further evaluation.

## 2017-02-24 LAB — COMPREHENSIVE METABOLIC PANEL
ALT: 14 U/L (ref 6–29)
AST: 21 U/L (ref 10–35)
Albumin: 4.4 g/dL (ref 3.6–5.1)
Alkaline Phosphatase: 50 U/L (ref 33–130)
BUN: 15 mg/dL (ref 7–25)
CO2: 24 mmol/L (ref 20–31)
Calcium: 9.5 mg/dL (ref 8.6–10.4)
Chloride: 100 mmol/L (ref 98–110)
Creat: 1.07 mg/dL — ABNORMAL HIGH (ref 0.50–0.99)
Glucose, Bld: 90 mg/dL (ref 65–99)
Potassium: 4.5 mmol/L (ref 3.5–5.3)
Sodium: 136 mmol/L (ref 135–146)
Total Bilirubin: 0.7 mg/dL (ref 0.2–1.2)
Total Protein: 7.1 g/dL (ref 6.1–8.1)

## 2017-02-24 LAB — LIPASE: Lipase: 11 U/L (ref 7–60)

## 2017-02-24 LAB — AMYLASE: Amylase: 32 U/L (ref 21–101)

## 2017-03-12 ENCOUNTER — Telehealth: Payer: Self-pay

## 2017-03-12 ENCOUNTER — Other Ambulatory Visit: Payer: Self-pay | Admitting: Family Medicine

## 2017-03-12 DIAGNOSIS — K219 Gastro-esophageal reflux disease without esophagitis: Secondary | ICD-10-CM

## 2017-03-12 MED ORDER — PANTOPRAZOLE SODIUM 40 MG PO TBEC: 40.0000 mg | DELAYED_RELEASE_TABLET | Freq: Every day | ORAL | 3 refills | Status: DC

## 2017-03-12 MED ORDER — ALPRAZOLAM 0.25 MG PO TABS: ORAL_TABLET | ORAL | 0 refills | Status: DC

## 2017-03-12 NOTE — Telephone Encounter (Signed)
[facility] Trixie Rude/RLB

## 2017-05-16 ENCOUNTER — Encounter (HOSPITAL_COMMUNITY): Payer: Self-pay | Admitting: Emergency Medicine

## 2017-05-16 ENCOUNTER — Emergency Department (HOSPITAL_COMMUNITY): Payer: BLUE CROSS/BLUE SHIELD

## 2017-05-16 DIAGNOSIS — Z79899 Other long term (current) drug therapy: Secondary | ICD-10-CM | POA: Diagnosis not present

## 2017-05-16 DIAGNOSIS — R110 Nausea: Secondary | ICD-10-CM | POA: Insufficient documentation

## 2017-05-16 DIAGNOSIS — Z7982 Long term (current) use of aspirin: Secondary | ICD-10-CM | POA: Insufficient documentation

## 2017-05-16 DIAGNOSIS — I10 Essential (primary) hypertension: Secondary | ICD-10-CM | POA: Diagnosis not present

## 2017-05-16 DIAGNOSIS — R1012 Left upper quadrant pain: Secondary | ICD-10-CM | POA: Insufficient documentation

## 2017-05-16 LAB — CBC
HCT: 39.5 % (ref 36.0–46.0)
Hemoglobin: 13.6 g/dL (ref 12.0–15.0)
MCH: 31.3 pg (ref 26.0–34.0)
MCHC: 34.4 g/dL (ref 30.0–36.0)
MCV: 91 fL (ref 78.0–100.0)
Platelets: 296 10*3/uL (ref 150–400)
RBC: 4.34 MIL/uL (ref 3.87–5.11)
RDW: 12.8 % (ref 11.5–15.5)
WBC: 13 10*3/uL — ABNORMAL HIGH (ref 4.0–10.5)

## 2017-05-16 LAB — URINALYSIS, ROUTINE W REFLEX MICROSCOPIC
Bilirubin Urine: NEGATIVE
Glucose, UA: NEGATIVE mg/dL
Hgb urine dipstick: NEGATIVE
Ketones, ur: NEGATIVE mg/dL
Nitrite: NEGATIVE
Protein, ur: 30 mg/dL — AB
Specific Gravity, Urine: 1.02 (ref 1.005–1.030)
pH: 8 (ref 5.0–8.0)

## 2017-05-16 LAB — COMPREHENSIVE METABOLIC PANEL
ALT: 20 U/L (ref 14–54)
AST: 28 U/L (ref 15–41)
Albumin: 4.4 g/dL (ref 3.5–5.0)
Alkaline Phosphatase: 52 U/L (ref 38–126)
Anion gap: 10 (ref 5–15)
BUN: 17 mg/dL (ref 6–20)
CO2: 27 mmol/L (ref 22–32)
Calcium: 9.8 mg/dL (ref 8.9–10.3)
Chloride: 100 mmol/L — ABNORMAL LOW (ref 101–111)
Creatinine, Ser: 1.23 mg/dL — ABNORMAL HIGH (ref 0.44–1.00)
GFR calc Af Amer: 51 mL/min — ABNORMAL LOW (ref >60)
GFR calc non Af Amer: 44 mL/min — ABNORMAL LOW (ref >60)
Glucose, Bld: 124 mg/dL — ABNORMAL HIGH (ref 65–99)
Potassium: 3.7 mmol/L (ref 3.5–5.1)
Sodium: 137 mmol/L (ref 135–145)
Total Bilirubin: 0.6 mg/dL (ref 0.3–1.2)
Total Protein: 7.1 g/dL (ref 6.5–8.1)

## 2017-05-16 LAB — I-STAT TROPONIN, ED: Troponin i, poc: 0 ng/mL (ref 0.00–0.08)

## 2017-05-16 LAB — LIPASE, BLOOD: Lipase: 29 U/L (ref 11–51)

## 2017-05-16 NOTE — ED Triage Notes (Signed)
Pt c/o LUQ pain onset @ 1800 tonight with radiating across and down abd and around to L flank. +nausea, pt took Zofran and Xanax at home which helped nausea.  Denies dysuria, pt reports some pressure to start urination.  Pt had similar episode @ 1 month ago that resolved on its own.

## 2017-05-17 ENCOUNTER — Other Ambulatory Visit: Payer: Self-pay

## 2017-05-17 ENCOUNTER — Emergency Department (HOSPITAL_COMMUNITY): Payer: BLUE CROSS/BLUE SHIELD

## 2017-05-17 ENCOUNTER — Encounter: Payer: Self-pay | Admitting: Family Medicine

## 2017-05-17 ENCOUNTER — Ambulatory Visit (INDEPENDENT_AMBULATORY_CARE_PROVIDER_SITE_OTHER): Payer: BLUE CROSS/BLUE SHIELD | Admitting: Family Medicine

## 2017-05-17 ENCOUNTER — Emergency Department (HOSPITAL_COMMUNITY)
Admission: EM | Admit: 2017-05-17 | Discharge: 2017-05-17 | Disposition: A | Discharge status: Home / Self Care | Payer: BLUE CROSS/BLUE SHIELD | Attending: Emergency Medicine | Admitting: Emergency Medicine

## 2017-05-17 VITALS — BP 122/72 | HR 53 | Temp 97.9°F | Wt 190.6 lb

## 2017-05-17 DIAGNOSIS — R1012 Left upper quadrant pain: Secondary | ICD-10-CM

## 2017-05-17 NOTE — ED Provider Notes (Signed)
[facility]-EMERGENCY DEPT Provider Note   CSN: [id number] Arrival date & time: 05/16/17  2107     History   Chief Complaint Chief Complaint  Patient presents with  . Abdominal Pain    HPI Stacey Irwin is a 66 y.o. female.  The history is provided by the patient.  Abdominal Pain   This is a new problem. The current episode started 6 to 12 hours ago. The problem occurs constantly. The problem has been gradually improving. The pain is located in the LUQ. The quality of the pain is sharp and burning. The pain is severe. Associated symptoms include belching and nausea. Pertinent negatives include fever, diarrhea, hematochezia, melena, vomiting, constipation and dysuria. The symptoms are aggravated by palpation. Nothing relieves the symptoms.  pt reports sudden onset of LUQ pain that radiates into back and lower abdomen at approximately 1730 on 05/16/17 No fever/vomiting She also reports associated chest burning No significant urinary symptoms Her symptoms are improving at this time  Pt reports similar episode this past spring, had negative abdominal US, seen by PCP as well and no symptoms since then until tonight   Past Medical History:  Diagnosis Date  . Dyslipidemia   . GERD (gastroesophageal reflux disease)   . Hypertension   . Obesity     Patient Active Problem List   Diagnosis Date Noted  . History of colonic polyps sessile serrated polyp/adenoma 08/21/2016  . Obesity (BMI 30-39.9) 10/10/2011  . Hypertension 10/10/2011  . Family history of heart disease in female family member before age 37 10/10/2011  . Hyperlipidemia LDL goal <130 06/12/2007    Past Surgical History:  Procedure Laterality Date  . APPENDECTOMY  1987  . CESAREAN SECTION  1995  . FRACTURE SURGERY  2002    OB History    No data available       Home Medications    Prior to Admission medications   Medication Sig Start Date End Date Taking? Authorizing Provider  ALPRAZolam Prudy Feeler) 0.25 MG tablet  TAKE 1 BY MOUTH AT BEDTIME AS NEEDED 03/12/17   Ronnald Nian, MD  aspirin 81 MG tablet Take 81 mg by mouth daily.    [provider]  atorvastatin (LIPITOR) 40 MG tablet Take 1 tablet (40 mg total) by mouth daily. 12/14/16   Ronnald Nian, MD  cholecalciferol (VITAMIN D) 1000 UNITS tablet Take 1,000 Units by mouth daily.      [provider]  lisinopril-hydrochlorothiazide (PRINZIDE,ZESTORETIC) 20-12.5 MG tablet Take 1 tablet by mouth daily. 12/14/16   Ronnald Nian, MD  magnesium 30 MG tablet Take 30 mg by mouth daily.    [provider]  Multiple Vitamins-Minerals (MULTIVITAMIN WITH MINERALS) tablet Take 1 tablet by mouth daily.    [provider]  pantoprazole (PROTONIX) 40 MG tablet Take 1 tablet (40 mg total) by mouth daily. 03/12/17   Ronnald Nian, MD  valACYclovir (VALTREX) 500 MG tablet Take 1 tablet (500 mg total) by mouth 2 (two) times daily. Patient not taking: Reported on 02/22/2017 12/14/16   Ronnald Nian, MD    Family History Family History  Problem Relation Age of Onset  . Stroke Mother   . Heart disease Mother 73       CVA  . Heart disease Father 44       MI  . Diabetes Brother   . Heart disease Maternal Grandmother   . Heart disease Maternal Grandfather   . Emphysema Maternal Grandfather     Social  History Social History  Substance Use Topics  . Smoking status: Never Smoker  . Smokeless tobacco: Never Used  . Alcohol use 0.0 oz/week     Comment: socially, 2-3/week     Allergies   Patient has no known allergies.   Review of Systems Review of Systems  Constitutional: Negative for fever.  Gastrointestinal: Positive for abdominal pain and nausea. Negative for constipation, diarrhea, hematochezia, melena and vomiting.  Genitourinary: Negative for dysuria.  Musculoskeletal: Positive for back pain.  Neurological: Negative for weakness and numbness.  All other systems reviewed and are negative.    Physical  Exam Updated Vital Signs BP (!) 152/75   Pulse (!) 55   Temp 97.6 F (36.4 C) (Oral)   Resp 20   Ht 1.702 m (5\' 7" )   Wt 86.2 kg (190 lb)   SpO2 98%   BMI 29.76 kg/m   Physical Exam  CONSTITUTIONAL: Well developed/well nourished HEAD: Normocephalic/atraumatic EYES: EOMI/PERRL ENMT: Mucous membranes moist NECK: supple no meningeal signs SPINE/BACK:entire spine nontender CV: S1/S2 noted, no murmurs/rubs/gallops noted LUNGS: Lungs are clear to auscultation bilaterally, no apparent distress ABDOMEN: soft, nontender, no rebound or guarding, bowel sounds noted throughout abdomen GU:no cva tenderness NEURO: Pt is awake/alert/appropriate, moves all extremitiesx4.  No facial droop.   EXTREMITIES: pulses normal/equal, full ROM SKIN: warm, color normal PSYCH: no abnormalities of mood noted, alert and oriented to situation  ED Treatments / Results  Labs (all labs ordered are listed, but only abnormal results are displayed) Labs Reviewed  COMPREHENSIVE METABOLIC PANEL - Abnormal; Notable for the following:       Result Value   Chloride 100 (*)    Glucose, Bld 124 (*)    Creatinine, Ser 1.23 (*)    GFR calc non Af Amer 44 (*)    GFR calc Af Amer 51 (*)    All other components within normal limits  CBC - Abnormal; Notable for the following:    WBC 13.0 (*)    All other components within normal limits  URINALYSIS, ROUTINE W REFLEX MICROSCOPIC - Abnormal; Notable for the following:    Protein, ur 30 (*)    Leukocytes, UA SMALL (*)    Bacteria, UA RARE (*)    Squamous Epithelial / LPF 0-5 (*)    All other components within normal limits  LIPASE, BLOOD  I-STAT TROPONIN, ED    EKG ED ECG REPORT   Date: 05/17/2017 0249am  Rate: 55  Rhythm: sinus bradycardia  QRS Axis: normal  Intervals: normal  ST/T Wave abnormalities: normal  Conduction Disutrbances:none  I have personally reviewed the EKG tracing and agree with the computerized printout as noted.  Radiology Ct  Abdomen Pelvis Wo Contrast  Result Date: 05/17/2017 CLINICAL DATA:  Acute onset of left flank pain and nausea. Initial encounter. EXAM: CT ABDOMEN AND PELVIS WITHOUT CONTRAST TECHNIQUE: Multidetector CT imaging of the abdomen and pelvis was performed following the standard protocol without IV contrast. COMPARISON:  Right upper quadrant ultrasound performed 02/22/2017 FINDINGS: Lower chest: Mild bibasilar atelectasis or scarring is noted. The visualized portions of the mediastinum are unremarkable. The visualized portions of the breast implants are grossly unremarkable. Hepatobiliary: The liver is unremarkable in appearance. The gallbladder is unremarkable in appearance. The common bile duct remains normal in caliber. Pancreas: The pancreas is within normal limits. Spleen: The spleen is unremarkable in appearance. Adrenals/Urinary Tract: The adrenal glands are unremarkable in appearance. Mild left renal scarring and atrophy are noted. There is no evidence of hydronephrosis.  No renal or ureteral stones are identified. No perinephric stranding is seen. Stomach/Bowel: The stomach is unremarkable in appearance. The small bowel is within normal limits. The patient is status post appendectomy. Minimal diverticulosis is noted at the the splenic flexure of the colon. Diffuse diverticulosis is noted along the sigmoid colon, without evidence of diverticulitis. The colon is largely filled with fluid and a small amount of stool. Vascular/Lymphatic: Scattered calcification is seen along the abdominal aorta and its branches. The abdominal aorta is otherwise grossly unremarkable. The inferior vena cava is grossly unremarkable. No retroperitoneal lymphadenopathy is seen. No pelvic sidewall lymphadenopathy is identified. Reproductive: The bladder is moderately distended and grossly unremarkable. The uterus is unremarkable in appearance. The ovaries are grossly symmetric. No suspicious adnexal masses are seen. Other: No additional  soft tissue abnormalities are seen. Musculoskeletal: No acute osseous abnormalities are identified. Right femoral hardware is incompletely imaged but appears grossly unremarkable. The visualized musculature is unremarkable in appearance. IMPRESSION: 1. No acute abnormality seen to explain the patient's symptoms. No evidence of hydronephrosis. No renal or ureteral stone seen. 2. Mild left renal scarring and atrophy noted. 3. Diffuse diverticulosis along the sigmoid colon, and minimal diverticulosis at the splenic flexure of the colon, without evidence of diverticulitis. Colon largely filled with fluid and a small amount of stool. 4. Scattered aortic atherosclerosis. Electronically Signed   By: Roanna RaiderJeffery  Chang M.D.   On: 05/17/2017 03:31   Dg Chest 2 View  Result Date: 05/16/2017 CLINICAL DATA:  Chest pain.  Upper abdominal pain and nausea. EXAM: CHEST  2 VIEW COMPARISON:  Chest CTA 10/19/2010 FINDINGS: Low lung volumes on the PA view. Streaky bibasilar opacities, less conspicuous on the lateral view. Pulmonary vasculature is normal. No confluent consolidation, pleural effusion, or pneumothorax. No acute osseous abnormalities are seen. IMPRESSION: Low lung volumes with streaky bibasilar opacities, likely atelectasis. Electronically Signed   By: Rubye OaksMelanie  Ehinger M.D.   On: 05/16/2017 22:59    Procedures Procedures  Medications Ordered in ED Medications - No data to display   Initial Impression / Assessment and Plan / ED Course  I have reviewed the triage vital signs and the nursing notes.  Pertinent labs & imaging results that were available during my care of the patient were reviewed by me and considered in my medical decision making (see chart for details).     3:17 AM Pt stable Reports pain was abrupt/sudden in abdomen that radiates to back and lower abdomen She also mentions chest burning She is now improved EKG unremarkable with negative troponin, doubt ACS Will obtain CT imaging for  further evaluation Pt declines pain meds at this time   At time of discharge, pt improved No distress No new complaints Discussed CT findings  No signs of diverticulitis, no signs of AAA and and no signs of ureteral stone  Advised to take daily PPI Referred to GI We discussed strict ER return precautions   Final Clinical Impressions(s) / ED Diagnoses   Final diagnoses:  Left upper quadrant pain    New Prescriptions New Prescriptions   No medications on file     Zadie RhineWickline, Raymund Manrique, MD 05/17/17 [phone]

## 2017-05-17 NOTE — ED Notes (Signed)
Pt states at 1700 last night she had sharp pains in her upper left abd that started suddenly. Pt states she took 4mg  of Zofran and 0.25mg  of Ativan prior to coming into the hospital. Pt reports 2 bowel movements. Pt denies burning during urination.

## 2017-05-17 NOTE — Patient Instructions (Signed)
[facility]: [phone] 

## 2017-05-17 NOTE — ED Notes (Signed)
Pt family to [facility] during downtime @ 0215 stating "this is ridiculous, this wait time is the most ridiculous thing I have ever seen, she is in pain" This RN offered pain medicine with no answer... Family continues to complain stating she will be emailing someone to let them know how ridiculous the wait time is, she requested our directors name so she can "email him" about the wait time. Family member is RN with [facility] so I tried to explain our wait time is backed up d/t holding pt in the ED waiting for beds upstairs. Still continues to complain.

## 2017-05-17 NOTE — Progress Notes (Signed)
   Subjective:    Patient ID: Stacey Irwin, female    DOB: 1950-10-14, 66 y.o.   MRN: [id number]  HPI Chief Complaint  Patient presents with  . ER follow-up    ER follow-up   She is here for a follow up ED visit for LUQ pain. She was seen in the ED last night and discharged this morning. States the ED physician told her she needed to see GI and take a PPI.  She denies pain at present. States she thinks her gallbladder is the reason for her pain and would also like to discuss seeing a surgeon to have it removed.   Denies fever, chills, chest pain, palpitations, shortness of breath, abdominal pain, N/V/D.     Reviewed allergies, medications, past medical, surgical,  and social history.    Review of Systems Pertinent positives and negatives in the history of present illness.     Objective:   Physical Exam BP 122/72   Pulse (!) 53   Temp 97.9 F (36.6 C) (Oral)   Wt 190 lb 9.6 oz (86.5 kg)   SpO2 98%   BMI 29.85 kg/m   Alert and in no distress. Not otherwise examined.      Assessment & Plan:  LUQ abdominal pain - Plan: Ambulatory referral to Gastroenterology  Reviewed ED note, labs and radiology results. Negative cardiac work up. No sign of infectious process.  Per CT, no evidence of gallbladder dysfunction. Will refer her to GI as recommended and hold off on surgery referral for now. Will appreciate GI recommendation for this as well.  She will continue taking a PPI.  Follow up as needed.

## 2017-05-21 ENCOUNTER — Telehealth: Payer: Self-pay | Admitting: Family Medicine

## 2017-05-21 DIAGNOSIS — R1012 Left upper quadrant pain: Secondary | ICD-10-CM

## 2017-05-21 NOTE — Telephone Encounter (Signed)
Called to  Set up the hida scan , and they we're  Closed for today

## 2017-05-21 NOTE — Telephone Encounter (Signed)
Set up HIDA scan and also document atherosclerosis in her record

## 2017-05-21 NOTE — Telephone Encounter (Signed)
Pt requesting to speak to Dr Susann Givens about her 2 hospital visits because it still has not been determined what is going on with her. Pt wants to know what her next step should be at this point.

## 2017-05-22 ENCOUNTER — Telehealth: Payer: Self-pay | Admitting: Gastroenterology

## 2017-05-22 NOTE — Telephone Encounter (Signed)
Called and l/m to set up scan with [facility].

## 2017-05-22 NOTE — Telephone Encounter (Signed)
Set up scan for pt on 06/01/2017 @ 8:30 /9:00am. Pt is aware of appt.

## 2017-05-23 ENCOUNTER — Telehealth: Payer: Self-pay | Admitting: Gastroenterology

## 2017-05-23 NOTE — Telephone Encounter (Signed)
Received GI records from [facility]. Patient states that she is a friend of Dr. Doreatha Martin and Carney Bern and is wanting to be seen by Dr. Lavon Paganini. Records placed on her desk for review.

## 2017-05-24 ENCOUNTER — Telehealth: Payer: Self-pay | Admitting: Internal Medicine

## 2017-05-24 NOTE — Telephone Encounter (Signed)
I called and spoke to hospital scheduling about hida scan Prior auth. You have to call the pt's insurance to see if prior Stacey Irwin is needed.

## 2017-05-25 NOTE — Telephone Encounter (Signed)
Patient called very upset that she was scheduled with APP Shanda Bumps. Pt states she was told she was being scheduled with Dr.Nandigam and she wants to see her asap. Notified pt that first open with Dr.Nandigam was 10.22.18 and she stated that was not good enough and she would be dead by then. Pt requesting a call back to fix this.

## 2017-05-25 NOTE — Telephone Encounter (Signed)
Spoke with the patient and apologized for the miscommunication. I will ask Dr Lavon Paganini for guidance on scheduling. Concerned it would non-productive to see an extender stating "a PA can't make the decision if I need to see a surgeon or not!" She does understand I will send this message to Dr Lavon Paganini.  Thanks me for my call

## 2017-05-26 NOTE — Telephone Encounter (Signed)
Ok to [employer], please check if if she can come in Thursday Aug 30?

## 2017-05-28 NOTE — Telephone Encounter (Signed)
No auth require pt aims per karen

## 2017-05-28 NOTE — Telephone Encounter (Signed)
Left a message offering the work in time. Requested a call back.

## 2017-05-29 ENCOUNTER — Ambulatory Visit (HOSPITAL_COMMUNITY)
Admission: RE | Admit: 2017-05-29 | Discharge: 2017-05-29 | Disposition: A | Discharge status: Home / Self Care | Payer: BLUE CROSS/BLUE SHIELD | Source: Ambulatory Visit | Attending: Family Medicine | Admitting: Family Medicine

## 2017-05-29 DIAGNOSIS — R1012 Left upper quadrant pain: Secondary | ICD-10-CM | POA: Diagnosis not present

## 2017-05-29 MED ORDER — TECHNETIUM TC 99M MEBROFENIN IV KIT
5.0000 | PACK | Freq: Once | INTRAVENOUS | Status: AC | PRN
  Administered 2017-05-29: 5 via INTRAVENOUS

## 2017-05-31 ENCOUNTER — Other Ambulatory Visit (INDEPENDENT_AMBULATORY_CARE_PROVIDER_SITE_OTHER): Payer: BLUE CROSS/BLUE SHIELD

## 2017-05-31 ENCOUNTER — Telehealth: Payer: Self-pay | Admitting: *Deleted

## 2017-05-31 ENCOUNTER — Other Ambulatory Visit: Payer: Self-pay | Admitting: Family Medicine

## 2017-05-31 ENCOUNTER — Encounter: Payer: Self-pay | Admitting: Gastroenterology

## 2017-05-31 ENCOUNTER — Ambulatory Visit (INDEPENDENT_AMBULATORY_CARE_PROVIDER_SITE_OTHER): Payer: BLUE CROSS/BLUE SHIELD | Admitting: Gastroenterology

## 2017-05-31 ENCOUNTER — Ambulatory Visit: Payer: BLUE CROSS/BLUE SHIELD | Admitting: Gastroenterology

## 2017-05-31 VITALS — BP 134/60 | HR 84 | Ht 66.25 in | Wt 190.0 lb

## 2017-05-31 DIAGNOSIS — R1012 Left upper quadrant pain: Secondary | ICD-10-CM

## 2017-05-31 DIAGNOSIS — R1013 Epigastric pain: Secondary | ICD-10-CM

## 2017-05-31 DIAGNOSIS — K5900 Constipation, unspecified: Secondary | ICD-10-CM | POA: Diagnosis not present

## 2017-05-31 DIAGNOSIS — R140 Abdominal distension (gaseous): Secondary | ICD-10-CM | POA: Diagnosis not present

## 2017-05-31 DIAGNOSIS — R109 Unspecified abdominal pain: Secondary | ICD-10-CM | POA: Diagnosis not present

## 2017-05-31 DIAGNOSIS — N261 Atrophy of kidney (terminal): Secondary | ICD-10-CM | POA: Diagnosis not present

## 2017-05-31 DIAGNOSIS — Z1231 Encounter for screening mammogram for malignant neoplasm of breast: Secondary | ICD-10-CM

## 2017-05-31 LAB — COMPREHENSIVE METABOLIC PANEL
ALT: 21 U/L (ref 0–35)
AST: 24 U/L (ref 0–37)
Albumin: 4.6 g/dL (ref 3.5–5.2)
Alkaline Phosphatase: 47 U/L (ref 39–117)
BUN: 14 mg/dL (ref 6–23)
CO2: 29 mEq/L (ref 19–32)
Calcium: 10 mg/dL (ref 8.4–10.5)
Chloride: 100 mEq/L (ref 96–112)
Creatinine, Ser: 1.04 mg/dL (ref 0.40–1.20)
GFR: 56.15 mL/min — ABNORMAL LOW (ref >60.00)
Glucose, Bld: 100 mg/dL — ABNORMAL HIGH (ref 70–99)
Potassium: 4 mEq/L (ref 3.5–5.1)
Sodium: 136 mEq/L (ref 135–145)
Total Bilirubin: 0.6 mg/dL (ref 0.2–1.2)
Total Protein: 7.3 g/dL (ref 6.0–8.3)

## 2017-05-31 LAB — CBC WITH DIFFERENTIAL/PLATELET
Basophils Absolute: 0.1 10*3/uL (ref 0.0–0.1)
Basophils Relative: 1.4 % (ref 0.0–3.0)
Eosinophils Absolute: 0.2 10*3/uL (ref 0.0–0.7)
Eosinophils Relative: 2.7 % (ref 0.0–5.0)
HCT: 38.8 % (ref 36.0–46.0)
Hemoglobin: 13.1 g/dL (ref 12.0–15.0)
Lymphocytes Relative: 30.9 % (ref 12.0–46.0)
Lymphs Abs: 2.2 10*3/uL (ref 0.7–4.0)
MCHC: 33.9 g/dL (ref 30.0–36.0)
MCV: 94.4 fl (ref 78.0–100.0)
Monocytes Absolute: 0.6 10*3/uL (ref 0.1–1.0)
Monocytes Relative: 7.9 % (ref 3.0–12.0)
Neutro Abs: 4 10*3/uL (ref 1.4–7.7)
Neutrophils Relative %: 57.1 % (ref 43.0–77.0)
Platelets: 259 10*3/uL (ref 150.0–400.0)
RBC: 4.11 Mil/uL (ref 3.87–5.11)
RDW: 12.7 % (ref 11.5–15.5)
WBC: 7 10*3/uL (ref 4.0–10.5)

## 2017-05-31 LAB — SEDIMENTATION RATE: Sed Rate: 21 mm/hr (ref 0–30)

## 2017-05-31 LAB — IGA: IgA: 240 mg/dL (ref 68–378)

## 2017-05-31 NOTE — Telephone Encounter (Signed)
Faxed last office notes insurance cards and demographics to [facility] today

## 2017-05-31 NOTE — Telephone Encounter (Signed)
Patient needs referral to [facility] Atrophic left kidney with scarring  Waiting on Dr Lavon PaganiniNandigam to finish dictating her note to fax referral

## 2017-05-31 NOTE — Progress Notes (Signed)
         Stacey Irwin    [id number]    09/07/1951  Primary Care Physician:Lalonde, John C, MD  Referring Physician: Lalonde, John C, MD [address] [place], [place] [address]  Chief complaint:  Epigastric and Left side abdominal pain  HPI: 66-year-old female here for new patient visit with complaints of epigastric abdominal pain that started in May 2018. She had an episode of severe epigastric abdominal pain on 02/21/2017 and went to [facility] ER for evaluation. She had abdominal ultrasound which showed no evidence of cholelithiasis or cholecystitis. Patient doesn't remember any exacerbating factor that could've triggered the pain. She was wearing restrictive spanx pad. But pain persisted even after she changed her clothing. She had 1 more episode of severe abdominal pain 2 weeks ago, we presented to the ER at [facility], had CT abdomen and pelvis which showed sigmoid diverticulosis, mild left renal scarring and atrophy, no evidence of nephrolithiasis or hydronephrosis.  He continues to have mild epigastric discomfort and also on the left side of the abdomen. No exacerbation of symptoms with diet or exercise. Her bowel habits have changed and recently feels more constipated. She takes oral magnesium daily to help with bowel movements. No nausea, heartburn, dysphagia, odynophagia, vomiting, diarrhea or blood per rectum.  HIDA scan 05/29/2017 showed normal gallbladder function. She never had EGD. Last colonoscopy in January 2018.     Outpatient Encounter Prescriptions as of 05/31/2017  Medication Sig  . ALPRAZolam (XANAX) 0.25 MG tablet TAKE 1 BY MOUTH AT BEDTIME AS NEEDED (Patient taking differently: Take 0.25 mg by mouth 2 (two) times daily as needed for anxiety. )  . aspirin 81 MG tablet Take 81 mg by mouth daily.  . atorvastatin (LIPITOR) 40 MG tablet Take 1 tablet (40 mg total) by mouth daily.  . cholecalciferol (VITAMIN D) 1000 UNITS tablet Take 1,000 Units by mouth daily.     . hydroxypropyl methylcellulose / hypromellose (ISOPTO TEARS / GONIOVISC) 2.5 % ophthalmic solution Place 1 drop into both eyes 3 (three) times daily as needed for dry eyes.  . lisinopril-hydrochlorothiazide (PRINZIDE,ZESTORETIC) 20-12.5 MG tablet Take 1 tablet by mouth daily.  . magnesium 30 MG tablet Take 30 mg by mouth daily.  . Multiple Vitamins-Minerals (MULTIVITAMIN WITH MINERALS) tablet Take 1 tablet by mouth daily.  . pantoprazole (PROTONIX) 40 MG tablet Take 1 tablet (40 mg total) by mouth daily.   No facility-administered encounter medications on file as of 05/31/2017.     Allergies as of 05/31/2017  . (No Known Allergies)    Past Medical History:  Diagnosis Date  . Dyslipidemia   . GERD (gastroesophageal reflux disease)   . Hypertension   . Obesity     Past Surgical History:  Procedure Laterality Date  . APPENDECTOMY  1987  . CESAREAN SECTION  1995  . FRACTURE SURGERY Right 2002   right femur    Family History  Problem Relation Age of Onset  . Stroke Mother   . Heart disease Mother 72       CVA  . Heart disease Father 72       MI  . Diabetes Brother   . Heart disease Maternal Grandmother   . Heart disease Maternal Grandfather   . Emphysema Maternal Grandfather     Social History   Social History  . Marital status: Divorced    Spouse name: N/A  . Number of children: 2  . Years of education: N/A   Occupational History  .   [employer]    Social History Main Topics  . Smoking status: Never Smoker  . Smokeless tobacco: Never Used  . Alcohol use 0.0 oz/week     Comment: socially, 2-3/week  . Drug use: No  . Sexual activity: Not Currently   Other Topics Concern  . Not on file   Social History Narrative   Works for [employer] of systems: Review of Systems  Constitutional: Negative for fever and chills.  positive for lack of energy HENT: Positive for sinus problems  Eyes: Negative for blurred vision.  Respiratory: Negative for  cough, shortness of breath and wheezing.   Cardiovascular: Negative for chest pain and palpitations.  Gastrointestinal: as per HPI Genitourinary: Negative for dysuria, urgency, frequency and hematuria.  Musculoskeletal: Negative for myalgias, back pain and joint pain.  Skin: Negative for itching and rash.  Neurological: Negative for dizziness, tremors, focal weakness, seizures and loss of consciousness.  Endo/Heme/Allergies: Positive for seasonal allergies.  Psychiatric/Behavioral: Negative for depression, suicidal ideas and hallucinations.  positive for insomnia All other systems reviewed and are negative.   Physical Exam: Vitals:   05/31/17 0938  BP: 134/60  Pulse: 84   Body mass index is 30.44 kg/m. Gen:      No acute distress HEENT:  EOMI, sclera anicteric Neck:     No masses; no thyromegaly Lungs:    Clear to auscultation bilaterally; normal respiratory effort CV:         Regular rate and rhythm; no murmurs Abd:      + bowel sounds; soft, non-tender; no palpable masses, no distension Ext:    No edema; adequate peripheral perfusion Skin:      Warm and dry; no rash Neuro: alert and oriented x 3 Psych: normal mood and affect  Data Reviewed:  Reviewed labs, radiology imaging, old records and pertinent past GI work up  Colonoscopy October 2008 Sigmoid diverticulosis otherwise normal exam  Colonoscopy November 2013 1 cm sessile serrated adenoma removed from ascending colon  Colonoscopy January 2018 Sessile serrated adenoma 1 cm removed from transverse colon, sigmoid diverticulosis   Assessment and Plan/Recommendations:  66 year old female with history of colon polyps(sessile serrated adenomas), sigmoid diverticulosis here for evaluation of epigastric abdominal pain for past 3-4 months Schedule for EGD to exclude peptic ulcer disease Follow-up CBC, CMP, ESR, TTG IgA antibody The risks and benefits as well as alternatives of endoscopic procedure(s) have been discussed  and reviewed. All questions answered. The patient agrees to proceed.  Noted left renal scarring and atrophy on CT scan: Will refer to urology to evaluate  Constipation: Advised patient to start taking magnesium Start Benefiber 1 tablespoon 3 times daily with meals MiraLAX half capful daily at bedtime as needed  Bloating with abdominal cramps secondary to IBS Trial of low FODMAP diet and probiotic VSL#3  Greater than 50% of the time used for counseling as well as treatment plan and follow-up. She had multiple questions which were answered to her satisfaction  K. Denzil Magnuson , MD [phone] Mon-Fri 8a-5p [phone] after 5p, weekends, holidays  CC: Denita Lung, MD

## 2017-05-31 NOTE — Addendum Note (Signed)
Addended by: Napoleon FormNANDIGAM, Jameyah Fennewald V on: 05/31/2017 01:50 PM   Modules accepted: Level of Service

## 2017-05-31 NOTE — Patient Instructions (Addendum)
You have been scheduled for an endoscopy. Please follow written instructions given to you at your visit today. If you use inhalers (even only as needed), please bring them with you on the day of your procedure. Your physician has requested that you go to www.startemmi.com and enter the access code given to you at your visit today. This web site gives a general overview about your procedure. However, you should still follow specific instructions given to you by our office regarding your preparation for the procedure.  Go to the basement for labs today  We will refer you to [facility] and contact you with that appointment  Stop Magnesium   Use Benfiber 1 tablespoon three times a day with meals  Take Miralax 1/2 capful daily at bedtime as needed  We have given you IBS diet sheets to fill out and bring with you to your next appointment   Low FODMAP diet given  Use VSL Probiotic  #3 112 units for 1 month

## 2017-06-01 ENCOUNTER — Ambulatory Visit (HOSPITAL_COMMUNITY): Payer: BLUE CROSS/BLUE SHIELD

## 2017-06-01 LAB — TISSUE TRANSGLUTAMINASE, IGA: Tissue Transglutaminase Ab, IgA: 1 U/mL (ref <4)

## 2017-06-06 NOTE — Telephone Encounter (Signed)
[facility] states they have received the referral and it's in process. They will contact the patient with an appt.

## 2017-06-11 NOTE — Telephone Encounter (Signed)
Patients appointment is scheduled on 07/12/2017 at 8:15am  Patient aware

## 2017-06-15 ENCOUNTER — Encounter: Payer: Self-pay | Admitting: Gastroenterology

## 2017-06-22 ENCOUNTER — Telehealth: Payer: Self-pay | Admitting: Gastroenterology

## 2017-06-22 NOTE — Telephone Encounter (Signed)
Please advise on charge Dr Lavon Paganini

## 2017-06-24 NOTE — Telephone Encounter (Signed)
No charge. 

## 2017-06-25 ENCOUNTER — Encounter: Payer: BLUE CROSS/BLUE SHIELD | Admitting: Gastroenterology

## 2017-06-30 ENCOUNTER — Other Ambulatory Visit: Payer: Self-pay | Admitting: Family Medicine

## 2017-06-30 DIAGNOSIS — I10 Essential (primary) hypertension: Secondary | ICD-10-CM

## 2017-07-02 NOTE — Telephone Encounter (Signed)
Has CPE 08/2017

## 2017-07-04 ENCOUNTER — Telehealth: Payer: Self-pay | Admitting: Gastroenterology

## 2017-07-04 NOTE — Telephone Encounter (Signed)
Patient instructions reviewed with her. Advised clear liquids only day of her procedure stating at 12:01am 07/31/17. Discussed what a clear liquid is. Advised she must fast for 3 hours prior to the 4 PM procedure time which means she is NPO starting at 1:00 pm 07/31/17. Questions invited and answered.

## 2017-07-04 NOTE — Telephone Encounter (Signed)
ok 

## 2017-07-05 ENCOUNTER — Encounter: Payer: BLUE CROSS/BLUE SHIELD | Admitting: Gastroenterology

## 2017-07-31 ENCOUNTER — Encounter: Payer: BLUE CROSS/BLUE SHIELD | Admitting: Gastroenterology

## 2017-08-02 ENCOUNTER — Ambulatory Visit: Payer: BLUE CROSS/BLUE SHIELD | Admitting: Gastroenterology

## 2017-08-07 ENCOUNTER — Ambulatory Visit: Payer: BLUE CROSS/BLUE SHIELD | Admitting: Family Medicine

## 2017-08-07 ENCOUNTER — Encounter: Payer: Self-pay | Admitting: Family Medicine

## 2017-08-07 VITALS — BP 120/70 | HR 55 | Resp 16 | Ht 66.5 in | Wt 193.2 lb

## 2017-08-07 DIAGNOSIS — E785 Hyperlipidemia, unspecified: Secondary | ICD-10-CM | POA: Diagnosis not present

## 2017-08-07 DIAGNOSIS — Z23 Encounter for immunization: Secondary | ICD-10-CM | POA: Diagnosis not present

## 2017-08-07 DIAGNOSIS — Z86010 Personal history of colonic polyps: Secondary | ICD-10-CM

## 2017-08-07 DIAGNOSIS — N649 Disorder of breast, unspecified: Secondary | ICD-10-CM

## 2017-08-07 DIAGNOSIS — Z0000 Encounter for general adult medical examination without abnormal findings: Secondary | ICD-10-CM

## 2017-08-07 DIAGNOSIS — Z8249 Family history of ischemic heart disease and other diseases of the circulatory system: Secondary | ICD-10-CM | POA: Diagnosis not present

## 2017-08-07 DIAGNOSIS — I700 Atherosclerosis of aorta: Secondary | ICD-10-CM | POA: Diagnosis not present

## 2017-08-07 DIAGNOSIS — I10 Essential (primary) hypertension: Secondary | ICD-10-CM

## 2017-08-07 DIAGNOSIS — K5730 Diverticulosis of large intestine without perforation or abscess without bleeding: Secondary | ICD-10-CM | POA: Diagnosis not present

## 2017-08-07 LAB — POCT URINALYSIS DIP (PROADVANTAGE DEVICE)
Bilirubin, UA: NEGATIVE
Glucose, UA: NEGATIVE mg/dL
Ketones, POC UA: NEGATIVE mg/dL
Leukocytes, UA: NEGATIVE
Nitrite, UA: NEGATIVE
Protein Ur, POC: NEGATIVE mg/dL
Specific Gravity, Urine: 1.015
Urobilinogen, Ur: NEGATIVE
pH, UA: 6 (ref 5.0–8.0)

## 2017-08-07 MED ORDER — LISINOPRIL-HYDROCHLOROTHIAZIDE 20-12.5 MG PO TABS: 1.0000 | ORAL_TABLET | Freq: Every day | ORAL | 3 refills | Status: DC

## 2017-08-07 MED ORDER — ATORVASTATIN CALCIUM 40 MG PO TABS: 40.0000 mg | ORAL_TABLET | Freq: Every day | ORAL | 3 refills | Status: AC

## 2017-08-07 NOTE — Progress Notes (Signed)
Subjective:    Patient ID: Stacey Irwin, female    DOB: 08-26-1951, 66 y.o.   MRN: [id number]  HPI she is here for complete examination. She has had difficulty with abdominal pain and has been seen in the emergency room twice for evaluation of this. She has had CT scan, HIDA scan and most recently colonoscopy, all of which was nondiagnostic. The colonoscopy did show evidence of diverticulosis. She has a previous history of colonic adenomatous polyps. The CT scan also showed diverticulosis and atherosclerosis. She has discussed getting an endoscopy but will hold off on at the present time. She also has reflux and is using pantoprazole with fairly good results. Continues on Lipitor. She has noted a fullness in the right breast. She has had previous breast augmentation. She does have a positive family history of heart disease. She continues to work and is considering going on [employer].  Review of Systems  All other systems reviewed and are negative.      Objective:   Physical Exam BP 120/70   Pulse (!) 55   Resp 16   Ht 5' 6.5" (1.689 m)   Wt 193 lb 3.2 oz (87.6 kg)   SpO2 94%   BMI 30.72 kg/m   General Appearance:    Alert, cooperative, no distress, appears stated age  Head:    Normocephalic, without obvious abnormality, atraumatic  Eyes:    PERRL, conjunctiva/corneas clear, EOM's intact, fundi    benign  Ears:    Normal TM's and external ear canals  Nose:   Nares normal, mucosa normal, no drainage or sinus   tenderness  Throat:   Lips, mucosa, and tongue normal; teeth and gums normal  Neck:   Supple, no lymphadenopathy;  thyroid:  no   enlargement/tenderness/nodules; no carotid   bruit or JVD  Back:    Spine nontender, no curvature, ROM normal, no CVA     tenderness  Lungs:     Clear to auscultation bilaterally without wheezes, rales or     ronchi; respirations unlabored      Heart:    Regular rate and rhythm, S1 and S2 normal, no murmur, rub   or gallop  Breast Exam:    No  tenderness,  or nipple discharge or inversion. Round 3-4 cm mass appreciated in the medial aspect of the breast around 2:00     No axillary lymphadenopathy  Abdomen:     Soft, non-tender, nondistended, normoactive bowel sounds,    no masses, no hepatosplenomegaly  Genitalia:  Deferred  Rectal:    Normal tone, no masses or tenderness; guaiac negative stool  Extremities:   No clubbing, cyanosis or edema  Pulses:   2+ and symmetric all extremities  Skin:   Skin color, texture, turgor normal, no rashes or lesions  Lymph nodes:   Cervical, supraclavicular, and axillary nodes normal  Neurologic:   CNII-XII intact, normal strength, sensation and gait; reflexes 2+ and symmetric throughout          Psych:   Normal mood, affect, hygiene and grooming.         Assessment & Plan:  Annual physical exam - Plan: POCT Urinalysis DIP (Proadvantage Device)  Need for Tdap vaccination - Plan: Tdap vaccine greater than or equal to 7yo IM  Essential hypertension  Hyperlipidemia LDL goal <130 - Plan: Lipid panel  History of colonic polyps sessile serrated polyp/adenoma  Family history of heart disease in female family member before age 67  Diverticulosis of large intestine without  hemorrhage  Aortic atherosclerosis (HCC)  Need for shingles vaccine - Plan: Varicella-zoster vaccine IM (Shingrix)  Breast lesion - Plan: MM Digital Diagnostic Bilat The breast lesion could be a result of leakage from the breast implant but mammogram is certainly appropriate. Her immunizations were updated. She will continue on her other medications.

## 2017-08-08 ENCOUNTER — Other Ambulatory Visit: Payer: Self-pay | Admitting: Family Medicine

## 2017-08-08 DIAGNOSIS — N649 Disorder of breast, unspecified: Secondary | ICD-10-CM

## 2017-08-08 LAB — LIPID PANEL
Cholesterol: 200 mg/dL — ABNORMAL HIGH (ref <200)
HDL: 69 mg/dL (ref >50)
LDL Cholesterol (Calc): 104 mg/dL (calc) — ABNORMAL HIGH
Non-HDL Cholesterol (Calc): 131 mg/dL (calc) — ABNORMAL HIGH (ref <130)
Total CHOL/HDL Ratio: 2.9 (calc) (ref <5.0)
Triglycerides: 156 mg/dL — ABNORMAL HIGH (ref <150)

## 2017-08-22 ENCOUNTER — Ambulatory Visit: Admission: RE | Admit: 2017-08-22 | Payer: BLUE CROSS/BLUE SHIELD | Source: Ambulatory Visit

## 2017-08-22 ENCOUNTER — Ambulatory Visit
Admission: RE | Admit: 2017-08-22 | Discharge: 2017-08-22 | Disposition: A | Discharge status: Home / Self Care | Payer: BLUE CROSS/BLUE SHIELD | Source: Ambulatory Visit | Attending: Family Medicine | Admitting: Family Medicine

## 2017-08-22 DIAGNOSIS — N649 Disorder of breast, unspecified: Secondary | ICD-10-CM

## 2017-09-06 NOTE — Telephone Encounter (Signed)
error 

## 2017-09-28 ENCOUNTER — Telehealth: Payer: Self-pay | Admitting: Family Medicine

## 2017-09-28 ENCOUNTER — Encounter: Payer: Self-pay | Admitting: Family Medicine

## 2017-09-28 NOTE — Telephone Encounter (Signed)
Pt called and needed copy of office visit emailed to her for insurance purpose and I emailed this to her.

## 2017-12-04 ENCOUNTER — Telehealth: Payer: Self-pay | Admitting: Internal Medicine

## 2017-12-04 NOTE — Telephone Encounter (Signed)
Left message for pt to call back to schedule 2nd shringrix. Must have it done before the end of the month

## 2017-12-25 ENCOUNTER — Other Ambulatory Visit (INDEPENDENT_AMBULATORY_CARE_PROVIDER_SITE_OTHER): Payer: BLUE CROSS/BLUE SHIELD

## 2017-12-25 DIAGNOSIS — Z23 Encounter for immunization: Secondary | ICD-10-CM

## 2018-01-03 ENCOUNTER — Other Ambulatory Visit: Payer: Self-pay | Admitting: Family Medicine

## 2018-01-03 DIAGNOSIS — K219 Gastro-esophageal reflux disease without esophagitis: Secondary | ICD-10-CM

## 2018-01-04 NOTE — Telephone Encounter (Signed)
[facility] is requesting to fill pt xanax. [facility]

## 2018-05-21 ENCOUNTER — Encounter: Payer: Self-pay | Admitting: Family Medicine

## 2018-05-21 ENCOUNTER — Ambulatory Visit: Payer: BLUE CROSS/BLUE SHIELD | Admitting: Family Medicine

## 2018-05-21 VITALS — BP 110/68 | HR 57 | Temp 97.6°F | Wt 200.0 lb

## 2018-05-21 DIAGNOSIS — Z79890 Hormone replacement therapy (postmenopausal): Secondary | ICD-10-CM

## 2018-05-21 DIAGNOSIS — H9313 Tinnitus, bilateral: Secondary | ICD-10-CM | POA: Diagnosis not present

## 2018-05-21 DIAGNOSIS — R413 Other amnesia: Secondary | ICD-10-CM

## 2018-05-21 MED ORDER — ESTRADIOL 0.025 MG/24HR TD PTTW: 1.0000 | MEDICATED_PATCH | TRANSDERMAL | 1 refills | Status: DC

## 2018-05-21 NOTE — Progress Notes (Signed)
   Subjective:    Patient ID: Stacey Irwin, female    DOB: 1951-05-06, 67 y.o.   MRN: [id number]  HPI She is here for consult concerning memory issues.  She has started to notice the fact that she has trouble remembering people's names and sometimes even wakes up in the morning and cannot remember what day it is.  She continues to work and says that she is not as quick as she used to be.  She would like to go back on HRT.  She states that she thinks she did better when she was on hormone replacement.  She has discussed this with her cousin who is a retired Estate agentB/GYN.  She is aware of the risks and benefits of HRT. She also complains of a long history of ringing in years.  Review of Systems     Objective:   Physical Exam Alert and in no distress.  MMSE is 30.       Assessment & Plan:  Memory change  Hormone replacement therapy (HRT) - Plan: estradiol (VIVELLE-DOT) 0.025 MG/24HR  Tinnitus of both ears I discussed HRT with her.  She is aware of the risks and benefits and would like to get back on Vivelle.  She will return here in 3 months and we will reevaluate the memory issue at that time. I discussed the tinnitus with him and explained that we really do not have a good therapy for this other than the possibility of white noise.  Also discussed possibly seeing an audiologist for a more formal hearing evaluation as she also notes a question of high-frequency hearing loss especially with dealing with her grandchildren.

## 2018-07-10 ENCOUNTER — Other Ambulatory Visit: Payer: Self-pay | Admitting: Family Medicine

## 2018-07-10 DIAGNOSIS — Z1231 Encounter for screening mammogram for malignant neoplasm of breast: Secondary | ICD-10-CM

## 2018-08-05 ENCOUNTER — Other Ambulatory Visit: Payer: Self-pay | Admitting: Family Medicine

## 2018-08-05 NOTE — Telephone Encounter (Signed)
[facility] is requesting to fill pt xanax [facility].

## 2018-08-15 ENCOUNTER — Encounter: Payer: Self-pay | Admitting: Family Medicine

## 2018-08-15 ENCOUNTER — Ambulatory Visit: Payer: BLUE CROSS/BLUE SHIELD | Admitting: Family Medicine

## 2018-08-15 VITALS — BP 112/74 | HR 53 | Temp 97.4°F | Wt 195.0 lb

## 2018-08-15 DIAGNOSIS — Z0000 Encounter for general adult medical examination without abnormal findings: Secondary | ICD-10-CM | POA: Diagnosis not present

## 2018-08-15 DIAGNOSIS — I700 Atherosclerosis of aorta: Secondary | ICD-10-CM

## 2018-08-15 DIAGNOSIS — E669 Obesity, unspecified: Secondary | ICD-10-CM | POA: Diagnosis not present

## 2018-08-15 DIAGNOSIS — K5730 Diverticulosis of large intestine without perforation or abscess without bleeding: Secondary | ICD-10-CM

## 2018-08-15 DIAGNOSIS — Z860100 Personal history of colon polyps, unspecified: Secondary | ICD-10-CM

## 2018-08-15 DIAGNOSIS — E785 Hyperlipidemia, unspecified: Secondary | ICD-10-CM | POA: Diagnosis not present

## 2018-08-15 DIAGNOSIS — I10 Essential (primary) hypertension: Secondary | ICD-10-CM

## 2018-08-15 DIAGNOSIS — Z79890 Hormone replacement therapy (postmenopausal): Secondary | ICD-10-CM | POA: Insufficient documentation

## 2018-08-15 DIAGNOSIS — K219 Gastro-esophageal reflux disease without esophagitis: Secondary | ICD-10-CM

## 2018-08-15 LAB — POCT URINALYSIS DIP (PROADVANTAGE DEVICE)
Bilirubin, UA: NEGATIVE
Glucose, UA: NEGATIVE mg/dL
Ketones, POC UA: NEGATIVE mg/dL
Leukocytes, UA: NEGATIVE
Nitrite, UA: NEGATIVE
Protein Ur, POC: NEGATIVE mg/dL
Specific Gravity, Urine: 1.015
Urobilinogen, Ur: 3.5
pH, UA: 6.5 (ref 5.0–8.0)

## 2018-08-15 MED ORDER — LISINOPRIL-HYDROCHLOROTHIAZIDE 20-12.5 MG PO TABS: 1.0000 | ORAL_TABLET | Freq: Every day | ORAL | 3 refills | Status: DC

## 2018-08-15 MED ORDER — PANTOPRAZOLE SODIUM 40 MG PO TBEC: 40.0000 mg | DELAYED_RELEASE_TABLET | Freq: Every day | ORAL | 3 refills | Status: DC

## 2018-08-15 NOTE — Addendum Note (Signed)
Addended by: Renelda LomaHENRY, Lord Lancour on: 08/15/2018 05:08 PM   Modules accepted: Orders

## 2018-08-15 NOTE — Progress Notes (Signed)
Subjective:    Patient ID: Stacey Irwin, female    DOB: 08/17/1951, 67 y.o.   MRN: [id number]  HPI She is here for complete examination.  She did have a previous history of a family history of heart disease but found out that the policy grew up with was not her biological father.  Her biological father did not have heart disease.  She does continue on Lipitor.  She also has reflux disease and does use Protonix but uses this on an as-needed basis.  Continues on her blood pressure medication.  She is also on Vivelle dot and does state that over the last 3 months she has definitely noted an improvement in her sleeping, energy, lack of hot flashes and very happy with this.  She rarely uses Xanax.  She keeps herself physically active.  Does have a history of aortic atherosclerosis by x-ray.  Also has a history of colonic polyps and is scheduled for repeat in a few years.  She is not smoking drinks rarely.  Family and social history as well as health maintenance and immunizations was reviewed.  Review of Systems  All other systems reviewed and are negative.      Objective:   Physical Exam BP 112/74 (BP Location: Left Arm, Patient Position: Sitting)   Pulse (!) 53   Temp (!) 97.4 F (36.3 C)   Wt 195 lb (88.5 kg)   SpO2 98%   BMI 31.00 kg/m   General Appearance:    Alert, cooperative, no distress, appears stated age  Head:    Normocephalic, without obvious abnormality, atraumatic  Eyes:    PERRL, conjunctiva/corneas clear, EOM's intact, fundi    benign  Ears:    Normal TM's and external ear canals  Nose:   Nares normal, mucosa normal, no drainage or sinus   tenderness  Throat:   Lips, mucosa, and tongue normal; teeth and gums normal  Neck:   Supple, no lymphadenopathy;  thyroid:  no   enlargement/tenderness/nodules; no carotid   bruit or JVD  Back:    Spine nontender, no curvature, ROM normal, no CVA     tenderness  Lungs:     Clear to auscultation bilaterally without wheezes, rales or      ronchi; respirations unlabored  Chest Wall:    No tenderness or deformity   Heart:    Regular rate and rhythm, S1 and S2 normal, no murmur, rub   or gallop  Breast Exam:    Deferred to GYN  Abdomen:     Soft, non-tender, nondistended, normoactive bowel sounds,    no masses, no hepatosplenomegaly  Genitalia:    Deferred to GYN     Extremities:   No clubbing, cyanosis or edema  Pulses:   2+ and symmetric all extremities  Skin:   Skin color, texture, turgor normal, no rashes or lesions  Lymph nodes:   Cervical, supraclavicular, and axillary nodes normal  Neurologic:   CNII-XII intact, normal strength, sensation and gait; reflexes 2+ and symmetric throughout          Psych:   Normal mood, affect, hygiene and grooming.          Assessment & Plan:  Annual physical exam - Plan: CBC with Differential/Platelet, Comprehensive metabolic panel, Lipid panel  Hyperlipidemia LDL goal <130 - Plan: Lipid panel  Essential hypertension - Plan: CBC with Differential/Platelet, Comprehensive metabolic panel, lisinopril-hydrochlorothiazide (PRINZIDE,ZESTORETIC) 20-12.5 MG tablet  Obesity (BMI 30-39.9)  Diverticulosis of large intestine without hemorrhage  Aortic atherosclerosis (  HCC)  History of colonic polyps sessile serrated polyp/adenoma  Hormone replacement therapy (HRT)  Gastroesophageal reflux disease without esophagitis - Plan: pantoprazole (PROTONIX) 40 MG tablet  I discussed the fact that she no longer has a family history of heart disease and it does change the dynamic and concerning her cholesterol.  We will wait on this blood work before making a decision.  Her other medications were renewed.  Encouraged her to continue to take good care of herself.  Discussed the possibility of her switching over to Medicare.  She will check into that

## 2018-08-16 ENCOUNTER — Telehealth: Payer: Self-pay

## 2018-08-16 LAB — CBC WITH DIFFERENTIAL/PLATELET
Basophils Absolute: 0.1 10*3/uL (ref 0.0–0.2)
Basos: 1 %
EOS (ABSOLUTE): 0.3 10*3/uL (ref 0.0–0.4)
Eos: 4 %
Hematocrit: 39.2 % (ref 34.0–46.6)
Hemoglobin: 13.6 g/dL (ref 11.1–15.9)
Immature Grans (Abs): 0 10*3/uL (ref 0.0–0.1)
Immature Granulocytes: 0 %
Lymphocytes Absolute: 2.7 10*3/uL (ref 0.7–3.1)
Lymphs: 34 %
MCH: 32.2 pg (ref 26.6–33.0)
MCHC: 34.7 g/dL (ref 31.5–35.7)
MCV: 93 fL (ref 79–97)
Monocytes Absolute: 0.5 10*3/uL (ref 0.1–0.9)
Monocytes: 7 %
Neutrophils Absolute: 4.2 10*3/uL (ref 1.4–7.0)
Neutrophils: 54 %
Platelets: 262 10*3/uL (ref 150–450)
RBC: 4.22 x10E6/uL (ref 3.77–5.28)
RDW: 12.4 % (ref 12.3–15.4)
WBC: 7.8 10*3/uL (ref 3.4–10.8)

## 2018-08-16 LAB — COMPREHENSIVE METABOLIC PANEL
ALT: 16 IU/L (ref 0–32)
AST: 20 IU/L (ref 0–40)
Albumin/Globulin Ratio: 1.8 (ref 1.2–2.2)
Albumin: 4.5 g/dL (ref 3.6–4.8)
Alkaline Phosphatase: 56 IU/L (ref 39–117)
BUN/Creatinine Ratio: 14 (ref 12–28)
BUN: 17 mg/dL (ref 8–27)
Bilirubin Total: 0.2 mg/dL (ref 0.0–1.2)
CO2: 24 mmol/L (ref 20–29)
Calcium: 9.5 mg/dL (ref 8.7–10.3)
Chloride: 101 mmol/L (ref 96–106)
Creatinine, Ser: 1.24 mg/dL — ABNORMAL HIGH (ref 0.57–1.00)
GFR calc Af Amer: 52 mL/min/{1.73_m2} — ABNORMAL LOW (ref >59)
GFR calc non Af Amer: 45 mL/min/{1.73_m2} — ABNORMAL LOW (ref >59)
Globulin, Total: 2.5 g/dL (ref 1.5–4.5)
Glucose: 85 mg/dL (ref 65–99)
Potassium: 4.3 mmol/L (ref 3.5–5.2)
Sodium: 141 mmol/L (ref 134–144)
Total Protein: 7 g/dL (ref 6.0–8.5)

## 2018-08-16 LAB — LIPID PANEL
Chol/HDL Ratio: 4.1 ratio (ref 0.0–4.4)
Cholesterol, Total: 271 mg/dL — ABNORMAL HIGH (ref 100–199)
HDL: 66 mg/dL (ref >39)
LDL Calculated: 172 mg/dL — ABNORMAL HIGH (ref 0–99)
Triglycerides: 166 mg/dL — ABNORMAL HIGH (ref 0–149)
VLDL Cholesterol Cal: 33 mg/dL (ref 5–40)

## 2018-08-16 NOTE — Progress Notes (Unsigned)
   Subjective:    Patient ID: Stacey GrillsCeleste Irwin, female    DOB: 1951-03-28, 67 y.o.   MRN: [id number]  HPI    Review of Systems     Objective:   Physical Exam        Assessment & Plan:

## 2018-08-16 NOTE — Telephone Encounter (Signed)
Called pt to advise of her lab results. Per JCL minor abnormalities. [facility]

## 2018-08-19 ENCOUNTER — Other Ambulatory Visit: Payer: Self-pay | Admitting: Family Medicine

## 2018-08-19 DIAGNOSIS — Z79890 Hormone replacement therapy (postmenopausal): Secondary | ICD-10-CM

## 2018-08-19 NOTE — Telephone Encounter (Signed)
[facility] is requesting to fill pt estradiol. Please advise. [facility]

## 2018-08-23 ENCOUNTER — Ambulatory Visit: Payer: BLUE CROSS/BLUE SHIELD

## 2018-12-03 ENCOUNTER — Other Ambulatory Visit: Payer: Self-pay | Admitting: Family Medicine

## 2018-12-03 DIAGNOSIS — Z79890 Hormone replacement therapy (postmenopausal): Secondary | ICD-10-CM

## 2018-12-03 DIAGNOSIS — K219 Gastro-esophageal reflux disease without esophagitis: Secondary | ICD-10-CM

## 2018-12-03 NOTE — Telephone Encounter (Signed)
[facility] is requesting to fill pt Estradiol. Please advise . [facility]

## 2019-02-27 ENCOUNTER — Other Ambulatory Visit: Payer: Self-pay | Admitting: Family Medicine

## 2019-02-27 NOTE — Telephone Encounter (Signed)
[facility] to fill pt xanax. Please advise [facility] 

## 2019-07-17 ENCOUNTER — Telehealth: Payer: Self-pay | Admitting: Family Medicine

## 2019-07-17 DIAGNOSIS — I10 Essential (primary) hypertension: Secondary | ICD-10-CM

## 2019-07-17 DIAGNOSIS — Z79890 Hormone replacement therapy (postmenopausal): Secondary | ICD-10-CM

## 2019-07-17 MED ORDER — ESTRADIOL 0.025 MG/24HR TD PTTW: 1.0000 | MEDICATED_PATCH | TRANSDERMAL | 1 refills | Status: AC

## 2019-07-17 MED ORDER — ALPRAZOLAM 0.25 MG PO TABS: ORAL_TABLET | ORAL | 0 refills | Status: AC

## 2019-07-17 MED ORDER — LISINOPRIL-HYDROCHLOROTHIAZIDE 20-12.5 MG PO TABS: 1.0000 | ORAL_TABLET | Freq: Every day | ORAL | 3 refills | Status: AC

## 2019-07-17 NOTE — Telephone Encounter (Signed)
Pt called and is wanting to know if you can give her a call, she wants to talk to you about a few things, She also wants to know if you know of any DR in [place]   And pt also needs refills on her Xanax, Estradiol, Lisionprol-hctz pt would like them sent to the [place], [place] - [address]   Pt can be reached at [phone]

## 2019-07-19 ENCOUNTER — Other Ambulatory Visit: Payer: Self-pay | Admitting: Family Medicine

## 2019-07-19 DIAGNOSIS — Z79890 Hormone replacement therapy (postmenopausal): Secondary | ICD-10-CM

## 2021-04-18 LAB — CBC WITH AUTO DIFFERENTIAL
Absolute Baso #: 0.1 10*3/uL (ref 0.0–0.2)
Absolute Eos #: 0.3 10*3/uL (ref 0.0–0.5)
Absolute Lymph #: 1.9 10*3/uL (ref 1.0–3.2)
Absolute Mono #: 0.6 10*3/uL (ref 0.3–1.0)
Basophils %: 1.2 % (ref 0.0–2.0)
Eosinophils %: 4.5 % (ref 0.0–7.0)
Hematocrit: 38.1 % (ref 34.0–47.0)
Hemoglobin: 12.7 g/dL (ref 11.5–15.7)
Immature Grans (Abs): 0.01 10*3/uL (ref 0.00–0.06)
Immature Granulocytes: 0.1 % (ref 0.0–0.6)
Lymphocytes: 28.1 % (ref 15.0–45.0)
MCH: 31.4 pg (ref 27.0–34.5)
MCHC: 33.3 g/dL (ref 32.0–36.0)
MCV: 94.3 fL (ref 81.0–99.0)
MPV: 11.5 fL (ref 7.2–13.2)
Monocytes: 8.6 % (ref 4.0–12.0)
NRBC Absolute: 0 10*3/uL (ref 0.000–0.012)
NRBC Automated: 0 % (ref 0.0–0.2)
Neutrophils %: 57.5 % (ref 42.0–74.0)
Neutrophils Absolute: 4 10*3/uL (ref 1.6–7.3)
Platelets: 246 10*3/uL (ref 140–440)
RBC: 4.04 x10e6/mcL (ref 3.60–5.20)
RDW: 12.6 % (ref 11.0–16.0)
WBC: 6.9 10*3/uL (ref 3.8–10.6)

## 2021-04-18 LAB — COMPREHENSIVE METABOLIC PANEL
ALT: 16 U/L (ref 0–35)
AST: 23 U/L (ref 0–35)
Albumin/Globulin Ratio: 1.9 (ref 1.00–2.70)
Albumin: 4.6 g/dL (ref 3.5–5.2)
Alk Phosphatase: 61 U/L (ref 35–117)
Anion Gap: 11 mmol/L (ref 2–17)
BUN: 16 mg/dL (ref 8–23)
Bun/Cre Ratio: 20 — ABNORMAL HIGH (ref 6.0–17.0)
CO2: 26 mmol/L (ref 22–29)
Calcium: 9.5 mg/dL (ref 8.8–10.2)
Chloride: 102 mmol/L (ref 98–107)
Creatinine: 0.8 mg/dL (ref 0.5–1.0)
Est, Glom Filt Rate: 79 mL/min/1.73m?? — ABNORMAL LOW (ref >=90)
Globulin: 2.4 g/dL (ref 1.9–4.4)
Glucose: 89 mg/dL (ref 70–99)
OSMOLALITY CALCULATED: 278 mOsm/kg (ref 270–287)
Potassium: 4 mmol/L (ref 3.5–5.3)
Sodium: 139 mmol/L (ref 135–145)
Total Bilirubin: 0.24 mg/dL (ref 0.00–1.20)
Total Protein: 7 g/dL (ref 6.4–8.3)

## 2021-04-18 LAB — HEMOGLOBIN A1C
Est. Avg. Glucose, WB: 105
Est. Avg. Glucose-calculated: 111
Hemoglobin A1C: 5.3 % (ref 4.0–6.0)

## 2021-04-18 LAB — LIPID PANEL
Chol/HDL Ratio: 4.4 (ref 0.0–4.4)
Cholesterol: 277 mg/dL — ABNORMAL HIGH (ref 100–200)
HDL: 63 mg/dL (ref >=50)
LDL Cholesterol: 178.6 mg/dL — ABNORMAL HIGH (ref 0.0–100.0)
LDL/HDL Ratio: 2.8
Triglycerides: 177 mg/dL — ABNORMAL HIGH (ref 0–149)
VLDL: 35.4 mg/dL (ref 5.0–40.0)

## 2021-07-21 ENCOUNTER — Telehealth: Admit: 2021-07-21 | Payer: MEDICARE | Attending: Family Medicine | Primary: Family Medicine

## 2021-07-21 DIAGNOSIS — U071 COVID-19: Secondary | ICD-10-CM

## 2021-07-21 MED ORDER — AZELASTINE HCL 0.15 % NA SOLN
0.15 % | NASAL | 3 refills | Status: AC
Start: 2021-07-21 — End: 2023-04-26

## 2021-07-21 MED ORDER — MAGIC MOUTHWASH
Freq: Four times a day (QID) | 0 refills | Status: AC | PRN
Start: 2021-07-21 — End: 2022-04-19

## 2021-07-21 MED ORDER — PREDNISONE 10 MG PO TABS
10 MG | ORAL_TABLET | ORAL | 0 refills | Status: AC
Start: 2021-07-21 — End: 2022-04-19

## 2021-07-21 NOTE — Progress Notes (Addendum)
Patient ID: Ashley Hogan is a 70 y.o. female.  Chief Complaint   Patient presents with    Other     + COVID 07/20/2021  DOXIMITY     Fever    Congestion    Pharyngitis      Pt here for recent covid dx-went on trip to visit family in [place], started with mild symptoms end of weekend, tested Monday morning after back and neg, then checked again Tuesday and positive.  Fatigue, sore throat with hard to swallow, some cough, congestion, and achiness. Low grade fever/chills, but mainly sore throat and achy/tired thus far.  Nothing used other than some astelin and all the sore throat sprays/lozenges she can throw at it thus far.  Reviewed paxlovid/etc and discussed pros/cons-elects against it now    [employer], was evaluated through a synchronous (real-time) audio-video encounter. The patient (or guardian if applicable) is aware that this is a billable service, which includes applicable co-pays. This Virtual Visit was conducted with patient's (and/or legal guardian's) consent. The visit was conducted pursuant to the emergency declaration under the [employer] and the [employer], 1135 waiver authority and the [employer] Act.  Patient identification was verified, and a caregiver was present when appropriate.   The patient was located at Home: [address] [facility]  [address] Boneta Lucks [address] [address].   Provider was located at [facility] (Appt Dept): [address]  [place],  [place] [address].      Total time spent for this encounter: Not billed by time    --Mayme Genta, MD on 07/21/2021 at 3:54 PM    An electronic signature was used to authenticate this note.     Other  Associated symptoms include a fever. Pertinent negatives include no abdominal pain, chest pain, chills, coughing, nausea or vomiting.   Fever   Pertinent negatives include no abdominal pain, chest pain, coughing, nausea or vomiting.   Pharyngitis  Associated symptoms  include a fever. Pertinent negatives include no abdominal pain, chest pain, chills, coughing, nausea or vomiting.   Past Medical History:   Diagnosis Date    Anxiety     Carotid stenosis     carotid stenosis less than 50% on carotid dopplers done 10/2020    Cold sore     Encounter for screening colonoscopy     [facility]-planned colonoscopy 04/28/2021    Hearing aid worn     Hearing loss     hearing aides for tinnitus/hearing loss    High cholesterol     Tinnitus       Past Surgical History:   Procedure Laterality Date    APPENDECTOMY      BREAST IMPLANT REMOVAL      CESAREAN SECTION      FEMUR FRACTURE SURGERY        Family History   Problem Relation Age of Onset    Stroke Mother     Alcohol Abuse Father     Other Brother         non lymph hodgkins      No Known Allergies   Social History     Tobacco Use    Smoking status: Never    Smokeless tobacco: Never   Vaping Use    Vaping Use: Never used   Substance Use Topics    Alcohol use: Yes     Comment: 2-4 Times a month    Drug use: Never  Review of Systems   Constitutional:  Positive for fever. Negative for chills.   Respiratory:  Negative for cough and shortness of breath.    Cardiovascular:  Negative for chest pain, palpitations and leg swelling.   Gastrointestinal:  Negative for abdominal pain, nausea and vomiting.   Patient Care Team:  Mayme Genta, MD as PCP - General  Jovita Gamma, MD as Surgeon (Gastroenterology)  Dorothey Baseman, MD as Consulting Physician (Otolaryngology)  Haze Boyden, MD (Neurology)  Minda Ditto, MD as Surgeon (Plastic Surgery)   Current Outpatient Medications   Medication Sig Dispense Refill    predniSONE (DELTASONE) 10 MG tablet 3 po x 2 days, then 2 po x 2 days, then 1 po x 2 days for pain and inflammation, take in morning with food or milk. 12 tablet 0    Magic Mouthwash (MIRACLE MOUTHWASH) Swish and spit 5-10 mLs 4 times daily as needed for Pain 100 mg nystatin, 4.25 ml Benadryl (50 mg/ml), 25  ml viscous lidocaine- add distilled water to equal 8 oz. Do not eat or drink for 30 minutes after use. 150 mL 0    azelastine HCl 0.15 % SOLN 2 sprays in each nostril Nasally Once a day for 30 day(s) 11 mL 3    ALPRAZolam (XANAX) 0.25 MG tablet 1 tablet Orally Twice a day      ezetimibe (ZETIA) 10 MG tablet 1 tablet Orally Once a day for 30 day(s)      fluticasone (FLONASE) 50 MCG/ACT nasal spray 1 spray in each nostril Nasally Once a day for 30 day(s)      levocetirizine (XYZAL) 5 MG tablet TAKE 1 TABLET BY MOUTH EVERY EVENING for 30      lisinopril (PRINIVIL;ZESTRIL) 5 MG tablet 1 tablet Orally Once a day for 30 day(s)      pantoprazole (PROTONIX) 40 MG tablet 1 tablet Orally Once a day for 30 day(s)      promethazine (PHENERGAN) 25 MG tablet 1 tablet as needed Orally every 12 hrs for 30 day(s) (Patient not taking: Reported on 07/21/2021)       No current facility-administered medications for this visit.      No data recorded   Objective:   No results found for: CBC, CMP, LIPIDPAN, TSH, HBA1C, VITD25OHEXT    BP      Temp      Pulse     Resp      SpO2       There is no height or weight on file to calculate BMI.    Physical Exam  Constitutional:       General: She is not in acute distress.     Appearance: Normal appearance. She is ill-appearing. She is not toxic-appearing.   HENT:      Head: Normocephalic and atraumatic.      Nose: Congestion present.      Comments: Congested speech  Eyes:      Extraocular Movements: Extraocular movements intact.      Conjunctiva/sclera: Conjunctivae normal.   Pulmonary:      Effort: Pulmonary effort is normal.      Comments: Conversant without obvious distress  Neurological:      General: No focal deficit present.      Mental Status: She is alert. Mental status is at baseline.   Psychiatric:         Mood and Affect: Mood normal.         Behavior: Behavior normal.  Thought Content: Thought content normal.     No results found for this visit on 07/21/21.   Assessment:       Encounter Diagnoses   Name Primary?    COVID-19 Yes    Sore throat           Plan:    Covid 19-reviewed most recent cdc guidance on quarantine and when able to return to public life and if masking required for that.  Discussed with patient.  Reviewed potential therapies as available or indicated including paxlovid, molnupiravir, mabs, and ivermectin, fluvoxamine. Discussed otc vitamins that may help with immune system function including vit c, d, zinc, quercetin, and potentially melatonin as well.  Answered patient questions to stated satisfaction.  Sore Throat-push fluids, cold liquids/freezer pops to help keep hydrated and ease swelling.  Some use of warm salt water gargles may also help at times. Avoid fruit juices and acidic foods like tomatoes now. Use chloraseptic/cepacol prn as well.  Medications as noted in encounter. Take any rx'ed medications fully.  Call if worsening or no better in a week.   Rtc prn  Orders Placed This Encounter    predniSONE (DELTASONE) 10 MG tablet     Sig: 3 po x 2 days, then 2 po x 2 days, then 1 po x 2 days for pain and inflammation, take in morning with food or milk.     Dispense:  12 tablet     Refill:  0    Magic Mouthwash (MIRACLE MOUTHWASH)     Sig: Swish and spit 5-10 mLs 4 times daily as needed for Pain 100 mg nystatin, 4.25 ml Benadryl (50 mg/ml), 25 ml viscous lidocaine- add distilled water to equal 8 oz. Do not eat or drink for 30 minutes after use.     Dispense:  150 mL     Refill:  0    azelastine HCl 0.15 % SOLN     Sig: 2 sprays in each nostril Nasally Once a day for 30 day(s)     Dispense:  11 mL     Refill:  3              Mayme Genta, MD

## 2021-07-21 NOTE — Telephone Encounter (Signed)
Pt just tested positive for Covid. C/O sever throat, please adv.

## 2021-07-21 NOTE — Telephone Encounter (Signed)
Would need a VV

## 2021-07-23 ENCOUNTER — Encounter

## 2021-07-28 ENCOUNTER — Encounter

## 2021-07-28 NOTE — Telephone Encounter (Signed)
Called to check on patient after recent covid. Feeling well, no sig symptoms remaining. Still tested positive recently and reviewed that means to make sure to use masks in any public situations in meantime but o/w she is doing well.  Appreciative of call and checkin.

## 2021-08-17 ENCOUNTER — Ambulatory Visit: Admit: 2021-08-17 | Discharge: 2021-08-17 | Payer: MEDICARE | Attending: Family Medicine | Primary: Family Medicine

## 2021-08-17 DIAGNOSIS — I10 Essential (primary) hypertension: Secondary | ICD-10-CM

## 2021-08-17 MED ORDER — BEMPEDOIC ACID 180 MG PO TABS
180 MG | ORAL_TABLET | ORAL | 5 refills | Status: AC
Start: 2021-08-17 — End: 2022-04-19

## 2021-08-17 NOTE — Progress Notes (Signed)
Patient ID: Ashley Hogan is a 70 y.o. female.  Chief Complaint   Patient presents with    Follow-up      Here for f/u...  Reviewed as noted prior..  pt was in allergic fog but memory improved with use of meds for allergies routinely.  she enjoys swimming/walking/etc.  on lisinopril for bp, prior on 20mg  but was bothering her and needs refills now. off of it recently  on zetia for cholesterol but intolerant to statins in past she says.  protonix for reflux, but just prn, but has noted off it last couple months and some increase in feeling like things get stuck  with swallow up in throat, and more at nite. no n/v and no blood noted.    Past Medical History:   Diagnosis Date    Anxiety     Carotid stenosis     carotid stenosis less than 50% on carotid dopplers done 10/2020    Cold sore     Encounter for screening colonoscopy     [facility]-planned colonoscopy 04/28/2021    Hearing aid worn     Hearing loss     hearing aides for tinnitus/hearing loss    High cholesterol     Screening mammogram for breast cancer 08/04/2021    benign mammo    Tinnitus       Past Surgical History:   Procedure Laterality Date    APPENDECTOMY      BREAST IMPLANT REMOVAL      CESAREAN SECTION      FEMUR FRACTURE SURGERY        Family History   Problem Relation Age of Onset    Stroke Mother     Alcohol Abuse Father     Other Brother         non lymph hodgkins      No Known Allergies   Social History     Tobacco Use    Smoking status: Never    Smokeless tobacco: Never   Vaping Use    Vaping Use: Never used   Substance Use Topics    Alcohol use: Yes     Comment: 2-4 Times a month    Drug use: Never      Review of Systems   Constitutional:  Negative for chills and fever.   Respiratory:  Negative for cough and shortness of breath.    Cardiovascular:  Negative for chest pain, palpitations and leg swelling.   Gastrointestinal:  Negative for abdominal pain, nausea and vomiting.   Patient Care Team:  13/11/2020, MD as PCP - General  Mayme Genta, MD as Surgeon (Gastroenterology)  Jovita Gamma, MD as Consulting Physician (Otolaryngology)  Dorothey Baseman, MD (Neurology)  Haze Boyden, MD as Surgeon (Plastic Surgery)   Current Outpatient Medications   Medication Sig Dispense Refill    azelastine HCl 0.15 % SOLN 2 sprays in each nostril Nasally Once a day for 30 day(s) 11 mL 3    ALPRAZolam (XANAX) 0.25 MG tablet 1 tablet Orally Twice a day      ezetimibe (ZETIA) 10 MG tablet 1 tablet Orally Once a day for 30 day(s)      fluticasone (FLONASE) 50 MCG/ACT nasal spray 1 spray in each nostril Nasally Once a day for 30 day(s)      levocetirizine (XYZAL) 5 MG tablet TAKE 1 TABLET BY MOUTH EVERY EVENING for 30      lisinopril (PRINIVIL;ZESTRIL) 5 MG tablet 1 tablet  Orally Once a day for 30 day(s)      pantoprazole (PROTONIX) 40 MG tablet 1 tablet Orally Once a day for 30 day(s)      predniSONE (DELTASONE) 10 MG tablet 3 po x 2 days, then 2 po x 2 days, then 1 po x 2 days for pain and inflammation, take in morning with food or milk. (Patient not taking: Reported on 08/17/2021) 12 tablet 0    Magic Mouthwash (MIRACLE MOUTHWASH) Swish and spit 5-10 mLs 4 times daily as needed for Pain 100 mg nystatin, 4.25 ml Benadryl (50 mg/ml), 25 ml viscous lidocaine- add distilled water to equal 8 oz. Do not eat or drink for 30 minutes after use. (Patient not taking: Reported on 08/17/2021) 150 mL 0    promethazine (PHENERGAN) 25 MG tablet 1 tablet as needed Orally every 12 hrs for 30 day(s) (Patient not taking: No sig reported)       No current facility-administered medications for this visit.      PHQ-9 Total Score: 2 (08/17/2021 11:18 AM)  Thoughts that you would be better off dead, or of hurting yourself in some way: 0 (08/17/2021 11:18 AM)     Objective:   No results found for: CBC, CMP, LIPIDPAN, TSH, HBA1C, VITD25OHEXT    BP (!) 143/79 (08/17/21 1119)    Temp      Pulse (!) 49 (08/17/21 1119)     Resp 16 (08/17/21 1119)    SpO2 97 %  (08/17/21 1119)     Body mass index is 27.8 kg/m??.  Lab Results   Component Value Date    WBC 6.9 04/18/2021    HGB 12.7 04/18/2021    HCT 38.1 04/18/2021    MCV 94.3 04/18/2021    PLT 246 04/18/2021     Lab Results   Component Value Date    NA 139 04/18/2021    K 4.0 04/18/2021    CL 102 04/18/2021    CO2 26 04/18/2021    BUN 16 04/18/2021    CREATININE 0.8 04/18/2021    GLUCOSE 89 04/18/2021    CALCIUM 9.5 04/18/2021    PROT 7.0 04/18/2021    LABALBU 4.6 04/18/2021    BILITOT 0.24 04/18/2021    ALKPHOS 61 04/18/2021    AST 23 04/18/2021    ALT 16 04/18/2021    LABGLOM 79 (L) 04/18/2021    GLOB 2.4 04/18/2021       Hemoglobin A1C   Date Value Ref Range Status   04/18/2021 5.3 4.0 - 6.0 % Final     Comment:     HEMOGLOBIN A1C INTERPRETATION:    The following arbitrary ranges may be used for interpretation of the results.  However, factors such as duration of diabetes, adherence to therapy, and  patient age should also be considered in assessing degree of blood glucose  control.    Hemoglobin A1C                 Avg. Blood Sugar  --------------------------------------------------------------  6%                           135 mg/dL  7%                           170 mg/dL  8%  205 mg/dL  9%                           240 mg/dL  10%                          275 mg/dL    ======================================================    A1C                      Glucose Control  ----------------------------------------------------------------  < 6.0 %                   Normal  6.0 - 6.9 %               Abnormal  7.0 - 7.9 %               Sub-Optimal Control  > 8.0 %                   Inadequate Control       Lab Results   Component Value Date    CHOL 277 (H) 04/18/2021     Lab Results   Component Value Date    TRIG 177 (H) 04/18/2021     Lab Results   Component Value Date    HDL 63 04/18/2021     Lab Results   Component Value Date    LDLCHOLESTEROL 178.6 (H) 04/18/2021     Lab Results   Component Value Date     VLDL 35.4 04/18/2021     Lab Results   Component Value Date    CHOLHDLRATIO 4.4 04/18/2021       Physical Exam  Constitutional:       General: She is not in acute distress.     Appearance: Normal appearance. She is not ill-appearing.   HENT:      Head: Normocephalic and atraumatic.   Eyes:      Extraocular Movements: Extraocular movements intact.      Pupils: Pupils are equal, round, and reactive to light.   Neck:      Vascular: No carotid bruit.   Cardiovascular:      Rate and Rhythm: Normal rate and regular rhythm.      Pulses: Normal pulses.      Heart sounds: Normal heart sounds. No murmur heard.  Pulmonary:      Effort: Pulmonary effort is normal.      Breath sounds: Normal breath sounds. No wheezing.   Abdominal:      General: Bowel sounds are normal. There is no distension.      Palpations: Abdomen is soft.      Tenderness: There is no abdominal tenderness.   Neurological:      General: No focal deficit present.      Mental Status: She is alert and oriented to person, place, and time. Mental status is at baseline.   Psychiatric:         Mood and Affect: Mood normal.         Behavior: Behavior normal.     No results found for this visit on 08/17/21.   Assessment:      Encounter Diagnoses   Name Primary?    Primary hypertension Yes    Stenosis of right carotid artery     Pure hypercholesterolemia     COVID-19     Statin intolerance  Plan:    Htn-Pt doing well on current medications, tolerating w/o sig side effects. Refilled meds if needed at this time. Reviewed briefly Dash diet and exercise/stress reduction techniques if applicable    Cholesterol-Pt tolerating current medications w/o sig side effects.  Reviewed lipid panel history.  Continue current medications-refill if needed.  Have advised mediterranean style diet as helpful for cholesterol and longevity generally.   Not to goal, will try nexlitol and see if toleratble and able to get.  Labs ordered to do in a month if tolerating ok.  If not  consider cards c/s and possibly [id number] or the like or risk consider with apob and cac score.    Bone density in jan planned.    Better from covid now, reviewed.     Get colonoscopy checked again soon.    Rtc in summer for awv or earlier prn    No orders of the defined types were placed in this encounter.           Mayme Genta, MD

## 2021-08-18 NOTE — Telephone Encounter (Signed)
Pt was seen 11/16 vv.

## 2021-08-18 NOTE — Telephone Encounter (Signed)
Dr Tawanna Cooler please adv. Pt would like to switch from xanax to klonopin

## 2021-08-18 NOTE — Telephone Encounter (Signed)
Pt is req to change her xanax to klonopin.

## 2021-08-18 NOTE — Telephone Encounter (Signed)
Called and discussed with her, mainly due to issues with sleep and brain fog. Was hoping this would be a change to a lighter version or something less problematic.  Of note she only uses it occasionally and mainly when she can't rest in middle of nite and most of time it works, just concerned it could be part of her brain fog.  Since on low dose and not using often, I doubt that would be the case, and discussed the differences.  Pt will continue with her current for now but call back if further issues or concerns in the future.  Appreciative for the callback

## 2021-08-18 NOTE — Telephone Encounter (Signed)
Needs appt for med change

## 2021-10-14 ENCOUNTER — Ambulatory Visit: Payer: PRIVATE HEALTH INSURANCE | Primary: Family Medicine

## 2021-10-31 ENCOUNTER — Encounter

## 2021-10-31 MED ORDER — ALPRAZOLAM 0.25 MG PO TABS
0.25 MG | ORAL_TABLET | ORAL | 3 refills | Status: AC
Start: 2021-10-31 — End: 2021-11-29

## 2021-10-31 NOTE — Telephone Encounter (Signed)
Rx loaded please es if ok

## 2021-10-31 NOTE — Telephone Encounter (Signed)
Medication sent in for patient now, please let them know

## 2021-10-31 NOTE — Telephone Encounter (Signed)
XANAX REFILL SENT TO [facility]

## 2021-12-14 MED ORDER — EZETIMIBE 10 MG PO TABS
10 MG | ORAL_TABLET | ORAL | 5 refills | Status: AC
Start: 2021-12-14 — End: 2022-11-16

## 2021-12-14 MED ORDER — LEVOCETIRIZINE DIHYDROCHLORIDE 5 MG PO TABS
5 MG | ORAL_TABLET | ORAL | 5 refills | Status: DC
Start: 2021-12-14 — End: 2022-04-19

## 2021-12-14 NOTE — Telephone Encounter (Signed)
REQUESTING BOTH MEDICATION TO SENT TO [facility].  LEVOCETIRIZINE 5MG  AND ZETIA 10MG  90 DAYS SUPPLY.

## 2021-12-14 NOTE — Telephone Encounter (Signed)
Rx loaded please es if ok

## 2021-12-14 NOTE — Telephone Encounter (Signed)
Pt states that she is out and will be traveling tomorrow. Rx loaded

## 2021-12-14 NOTE — Telephone Encounter (Signed)
Ok, sent to [facility] as requested, please let her know, thx

## 2022-01-05 MED ORDER — LISINOPRIL 5 MG PO TABS
5 MG | ORAL_TABLET | ORAL | 1 refills | Status: DC
Start: 2022-01-05 — End: 2022-04-19

## 2022-01-05 NOTE — Telephone Encounter (Signed)
Medication sent in for patient now, please let them know

## 2022-01-05 NOTE — Telephone Encounter (Signed)
Rx loaded please es if ok

## 2022-04-19 ENCOUNTER — Ambulatory Visit: Admit: 2022-04-19 | Discharge: 2022-04-19 | Payer: MEDICARE | Attending: Family Medicine | Primary: Family Medicine

## 2022-04-19 ENCOUNTER — Encounter

## 2022-04-19 DIAGNOSIS — Z0000 Encounter for general adult medical examination without abnormal findings: Secondary | ICD-10-CM

## 2022-04-19 LAB — COMPREHENSIVE METABOLIC PANEL
ALT: 14 U/L (ref 0–35)
AST: 21 U/L (ref 0–35)
Albumin/Globulin Ratio: 2.4 (ref 1.00–2.70)
Albumin: 4.7 g/dL (ref 3.5–5.2)
Alk Phosphatase: 56 U/L (ref 35–117)
Anion Gap: 11 mmol/L (ref 2–17)
BUN: 16 mg/dL (ref 8–23)
CO2: 25 mmol/L (ref 22–29)
Calcium: 9.3 mg/dL (ref 8.8–10.2)
Chloride: 103 mmol/L (ref 98–107)
Creatinine: 0.8 mg/dL (ref 0.5–1.0)
Est, Glom Filt Rate: 78 mL/min/1.73m (ref >=60)
Globulin: 2 g/dL (ref 1.9–4.4)
Glucose: 83 mg/dL (ref 70–99)
OSMOLALITY CALCULATED: 278 mOsm/kg (ref 270–287)
Potassium: 4.9 mmol/L (ref 3.5–5.3)
Sodium: 139 mmol/L (ref 135–145)
Total Bilirubin: 0.21 mg/dL (ref 0.00–1.20)
Total Protein: 6.7 g/dL (ref 6.4–8.3)

## 2022-04-19 LAB — CBC WITH AUTO DIFFERENTIAL
Absolute Baso #: 0.1 10*3/uL (ref 0.0–0.2)
Absolute Eos #: 0.4 10*3/uL (ref 0.0–0.5)
Absolute Lymph #: 2.3 10*3/uL (ref 1.0–3.2)
Absolute Mono #: 0.7 10*3/uL (ref 0.3–1.0)
Basophils %: 1.2 % (ref 0.0–2.0)
Eosinophils %: 5.2 % (ref 0.0–7.0)
Hematocrit: 40.6 % (ref 34.0–47.0)
Hemoglobin: 13.1 g/dL (ref 11.5–15.7)
Immature Grans (Abs): 0.01 10*3/uL (ref 0.00–0.06)
Immature Granulocytes: 0.1 % (ref 0.0–0.6)
Lymphocytes: 32.7 % (ref 15.0–45.0)
MCH: 31.4 pg (ref 27.0–34.5)
MCHC: 32.3 g/dL (ref 32.0–36.0)
MCV: 97.4 fL (ref 81.0–99.0)
MPV: 11.4 fL (ref 7.2–13.2)
Monocytes: 9.9 % (ref 4.0–12.0)
NRBC Absolute: 0 10*3/uL (ref 0.000–0.012)
NRBC Automated: 0 % (ref 0.0–0.2)
Neutrophils %: 50.9 % (ref 42.0–74.0)
Neutrophils Absolute: 3.5 10*3/uL (ref 1.6–7.3)
Platelets: 241 10*3/uL (ref 140–440)
RBC: 4.17 x10e6/mcL (ref 3.60–5.20)
RDW: 13.1 % (ref 11.0–16.0)
WBC: 6.9 10*3/uL (ref 3.8–10.6)

## 2022-04-19 LAB — LIPID PANEL
Chol/HDL Ratio: 4.3 (ref 0.0–4.4)
Cholesterol: 277 mg/dL — ABNORMAL HIGH (ref 100–200)
HDL: 64 mg/dL (ref >=50)
LDL Cholesterol: 178.8 mg/dL — ABNORMAL HIGH (ref 0.0–100.0)
LDL/HDL Ratio: 2.8
Triglycerides: 171 mg/dL — ABNORMAL HIGH (ref 0–149)
VLDL: 34.2 mg/dL (ref 5.0–40.0)

## 2022-04-19 LAB — HEPATITIS C ANTIBODY: Hepatitis C Ab: NEGATIVE

## 2022-04-19 MED ORDER — LISINOPRIL 5 MG PO TABS
5 MG | ORAL_TABLET | ORAL | 3 refills | Status: AC
Start: 2022-04-19 — End: 2023-04-26

## 2022-04-19 NOTE — Progress Notes (Signed)
Patient ID: Ashley Hogan is a 71 y.o. female.  Chief Complaint   Patient presents with    Medicare AWV    Memory Loss      Here for awv and medically indicated f/u...  Pt here for yearly checkup/review.  Reviewed healthy habits, advice, immunizations, labs, and studies that may be pertinent for preventive care in this patient.  Handout on healthy habits offered and reviewed as well.   Reviewed as noted prior..  pt was in allergic fog but memory improved with use of meds for allergies routinely.  she enjoys swimming/walking/etc.  on lisinopril for bp, prior on 20mg  but was bothering her and needs refills now. off of it recently  on zetia for cholesterol but intolerant to statins in past she says-hasn't been able even to continue on zetia-says she understands risks, but prefers at this age to just not be on something. Discussed supplements like fish oil and red yeast rice.   protonix for reflux, but just prn,  no n/v and no blood noted.  Considering ozempic for weight loss, may try that she says.    Memory Loss      Past Medical History:   Diagnosis Date    Anxiety     Carotid stenosis     carotid stenosis less than 50% on carotid dopplers done 10/2020    Cold sore     Encounter for screening colonoscopy 04/2021    [facility]-planned colonoscopy 04/28/2021-diverticula/internal hemorrhoids, o/w unremarkable. recheck 5 years per [facility]    Hearing aid worn     Hearing loss     hearing aides for tinnitus/hearing loss    High cholesterol     Screening mammogram for breast cancer 08/04/2021    benign mammo    Tinnitus       Past Surgical History:   Procedure Laterality Date    APPENDECTOMY      BREAST IMPLANT REMOVAL      CESAREAN SECTION      FEMUR FRACTURE SURGERY        Family History   Problem Relation Age of Onset    Stroke Mother     Alcohol Abuse Father     Other Brother         non lymph hodgkins      No Known Allergies   Social History     Tobacco Use    Smoking status: Never    Smokeless tobacco: Never    Vaping Use    Vaping Use: Never used   Substance Use Topics    Alcohol use: Yes     Comment: 2-4 Times a month    Drug use: Never      Review of Systems   Constitutional:  Negative for chills and fever.   Respiratory:  Negative for cough and shortness of breath.    Cardiovascular:  Negative for chest pain, palpitations and leg swelling.   Gastrointestinal:  Negative for abdominal pain, nausea and vomiting.   Psychiatric/Behavioral:  Positive for memory loss.    Patient Care Team:  13/11/2020, MD as PCP - General  Mayme Genta, MD as PCP - Empaneled Provider  Mayme Genta, MD as Surgeon (Gastroenterology)  Jovita Gamma, MD as Consulting Physician (Otolaryngology)  Dorothey Baseman, MD (Neurology)  Haze Boyden, MD as Surgeon (Plastic Surgery)  Minda Ditto, MD as Consulting Physician (Vascular Surgery)   Current Outpatient Medications   Medication Sig Dispense Refill    ALPRAZolam (  XANAX) 0.25 MG tablet Take 1 tablet by mouth nightly as needed for Sleep. Max Daily Amount: 0.25 mg      lisinopril (PRINIVIL;ZESTRIL) 5 MG tablet 1 tablet Orally Once a day for 30 day(s) 90 tablet 3    ezetimibe (ZETIA) 10 MG tablet 1 tablet Orally Once a day for 30 day(s) (Patient not taking: Reported on 04/19/2022) 30 tablet 5    azelastine HCl 0.15 % SOLN 2 sprays in each nostril Nasally Once a day for 30 day(s) 11 mL 3    fluticasone (FLONASE) 50 MCG/ACT nasal spray 1 spray in each nostril Nasally Once a day for 30 day(s)      pantoprazole (PROTONIX) 40 MG tablet 1 tablet Orally Once a day for 30 day(s)       No current facility-administered medications for this visit.      PHQ-9 Total Score: 0 (04/19/2022 11:19 AM)     Objective:   No results found for: CBC, CMP, LIPIDPAN, TSH, HBA1C, VITD25OHEXT    BP 135/75 (04/19/22 1159)    Temp      Pulse 56 (04/19/22 1159)   Resp 16 (04/19/22 1111)    SpO2 97 % (04/19/22 1111)     Body mass index is 28.04 kg/m.  Lab Results   Component Value Date     WBC 6.9 04/18/2021    HGB 12.7 04/18/2021    HCT 38.1 04/18/2021    MCV 94.3 04/18/2021    PLT 246 04/18/2021     Lab Results   Component Value Date    NA 139 04/18/2021    K 4.0 04/18/2021    CL 102 04/18/2021    CO2 26 04/18/2021    BUN 16 04/18/2021    CREATININE 0.8 04/18/2021    GLUCOSE 89 04/18/2021    CALCIUM 9.5 04/18/2021    PROT 7.0 04/18/2021    LABALBU 4.6 04/18/2021    BILITOT 0.24 04/18/2021    ALKPHOS 61 04/18/2021    AST 23 04/18/2021    ALT 16 04/18/2021    LABGLOM 79 (L) 04/18/2021    GLOB 2.4 04/18/2021     Hemoglobin A1C   Date Value Ref Range Status   04/18/2021 5.3 4.0 - 6.0 % Final     Comment:     HEMOGLOBIN A1C INTERPRETATION:    The following arbitrary ranges may be used for interpretation of the results.  However, factors such as duration of diabetes, adherence to therapy, and  patient age should also be considered in assessing degree of blood glucose  control.    Hemoglobin A1C                 Avg. Blood Sugar  --------------------------------------------------------------  6%                           135 mg/dL  7%                           170 mg/dL  8%                           205 mg/dL  9%                           240 mg/dL  21%  275 mg/dL    ======================================================    A1C                      Glucose Control  ----------------------------------------------------------------  < 6.0 %                   Normal  6.0 - 6.9 %               Abnormal  7.0 - 7.9 %               Sub-Optimal Control  > 8.0 %                   Inadequate Control       Lab Results   Component Value Date    CHOL 277 (H) 04/18/2021     Lab Results   Component Value Date    TRIG 177 (H) 04/18/2021     Lab Results   Component Value Date    HDL 63 04/18/2021     Lab Results   Component Value Date    LDLCHOLESTEROL 178.6 (H) 04/18/2021     Lab Results   Component Value Date    VLDL 35.4 04/18/2021     Lab Results   Component Value Date    CHOLHDLRATIO 4.4  04/18/2021     No results found for: TSHFT4, TSH, TSHREFLEX, TSH3GEN  No results found for: PSA, PSADIA    Physical Exam  Constitutional:       General: She is not in acute distress.     Appearance: Normal appearance. She is not ill-appearing.   HENT:      Head: Normocephalic and atraumatic.   Eyes:      Extraocular Movements: Extraocular movements intact.      Pupils: Pupils are equal, round, and reactive to light.   Neck:      Vascular: No carotid bruit.   Cardiovascular:      Rate and Rhythm: Normal rate and regular rhythm.      Pulses: Normal pulses.      Heart sounds: Normal heart sounds. No murmur heard.  Pulmonary:      Effort: Pulmonary effort is normal.      Breath sounds: Normal breath sounds. No wheezing.   Abdominal:      General: Bowel sounds are normal. There is no distension.      Palpations: Abdomen is soft.      Tenderness: There is no abdominal tenderness.   Neurological:      General: No focal deficit present.      Mental Status: She is alert and oriented to person, place, and time. Mental status is at baseline.   Psychiatric:         Mood and Affect: Mood normal.         Behavior: Behavior normal.     No results found for this visit on 04/19/22.   Assessment:      Encounter Diagnoses   Name Primary?    Encounter for annual wellness visit (AWV) in Medicare patient Yes    Primary hypertension     Pure hypercholesterolemia     Stenosis of right carotid artery     Statin intolerance     Allergic rhinitis due to pollen, unspecified seasonality     Bilateral hearing loss, unspecified hearing loss type     Menopause     Screening for osteoporosis     Need for pneumococcal vaccination  Need for hepatitis C screening test     Screening for diabetes mellitus     Screening mammogram for breast cancer     Need for Tdap vaccination           Plan:    AWV-reviewed with patient on all the elements of the AWV screening and preventive care.  Plan of care if needed made with patient today.  Discussed  appropriate diet/exercise recommendations for their lifestyle/status today.  Reviewed pertinent age appropriate screening/preventive issues including home/fall risks, hearing needs, and smoking/etoh review as well as indicated immunizations and studies/labs pertinent based on their age/risk factors.  Answered any questions to stated satisfaction.    Htn-Pt doing well on current medications, tolerating w/o sig side effects. Refilled meds if needed at this time. Reviewed briefly Dash diet and exercise/stress reduction techniques if applicable    Cholesterol-Pt tolerating current medications w/o sig side effects.  Reviewed lipid panel history.  Continue current medications-refill if needed.  Have advised mediterranean style diet as helpful for cholesterol and longevity generally.     Mild carotid stenosis-mild, continue on bp and chol reduction and use of asa     Hearing loss-followed by ent/audio.    Discussed tdap and pneumonia vaccines-wants to look on her prior chart  Recommended getting shingles vaccine.    Mammo and bone density coming due.    Memory-discussed, she passed her testing, but notes some changes.  Discussed sleep, exercise, learning new things, and some nootropics possibly as help on this in meantime.    Rtc in 1 year. Earlier prn.  Orders Placed This Encounter    MAM DIGITAL SCREEN W OR WO CAD BILATERAL     Standing Status:   Future     Standing Expiration Date:   06/21/2023    DEXA BONE DENSITY AXIAL SKELETON     Standing Status:   Future     Standing Expiration Date:   04/20/2023    CBC with Auto Differential     Standing Status:   Future     Standing Expiration Date:   10/21/2023    Comprehensive Metabolic Panel     Standing Status:   Future     Standing Expiration Date:   10/21/2023    Lipid Panel     Standing Status:   Future     Standing Expiration Date:   10/21/2023    Hemoglobin A1C     Standing Status:   Future     Standing Expiration Date:   10/21/2023    Hepatitis C Antibody     Standing Status:    Future     Standing Expiration Date:   04/20/2023    ALPRAZolam (XANAX) 0.25 MG tablet     Sig: Take 1 tablet by mouth nightly as needed for Sleep. Max Daily Amount: 0.25 mg    lisinopril (PRINIVIL;ZESTRIL) 5 MG tablet     Sig: 1 tablet Orally Once a day for 30 day(s)     Dispense:  90 tablet     Refill:  3            Mayme Genta, MD

## 2022-04-20 LAB — HEMOGLOBIN A1C
Est. Avg. Glucose, WB: 103
Est. Avg. Glucose-calculated: 108
Hemoglobin A1C: 5.2 % (ref 4.0–6.0)

## 2022-05-03 NOTE — Telephone Encounter (Signed)
Pt called stated that she watched a documentary about metal in the human body and wants to know how do she go about getting tested for copper poison

## 2022-05-06 NOTE — Telephone Encounter (Signed)
Not something I know a ton about honestly. Can do a blood test I think for copper levels, but not something usually done nor likely paid for by insurance.  If interested we can discuss her concerns at a brief visit sometime in future. thx

## 2022-05-08 NOTE — Telephone Encounter (Signed)
LMTC AND SENT ON MY CHART

## 2022-08-22 ENCOUNTER — Telehealth

## 2022-08-22 NOTE — Telephone Encounter (Signed)
Refill needed on her alprazolam.

## 2022-08-22 NOTE — Telephone Encounter (Signed)
Rx loaded please es if okay.

## 2022-08-23 MED ORDER — ALPRAZOLAM 0.25 MG PO TABS
0.25 MG | ORAL_TABLET | Freq: Every evening | ORAL | 3 refills | Status: AC | PRN
Start: 2022-08-23 — End: 2022-12-21

## 2022-08-23 NOTE — Telephone Encounter (Signed)
Ok, sent now, appeared as canceled earlier.  Hasnt had filled in awhile it appears, but ok to fill if no other concerns noted. thx

## 2022-11-01 ENCOUNTER — Ambulatory Visit
Admit: 2022-11-01 | Discharge: 2022-11-01 | Payer: MEDICARE | Attending: Obstetrics & Gynecology | Primary: Family Medicine

## 2022-11-01 DIAGNOSIS — Z01419 Encounter for gynecological examination (general) (routine) without abnormal findings: Secondary | ICD-10-CM

## 2022-11-01 MED ORDER — ESTRING 2 MG VA RING
2 MG | Freq: Once | VAGINAL | 4 refills | Status: AC
Start: 2022-11-01 — End: 2022-11-01

## 2022-11-01 NOTE — Progress Notes (Signed)
Annual Exam Post Menopausal    Ashley Hogan is a [id number], 72 y.o. female.  No LMP recorded.  New Patient and Annual Exam (Hard to remember some things )     72 y/o P3 here for annual/NP.  SVD x 2 (last one breech!) then LTCS for breech. Daughters live here. Moved here from [place], her female cousin was her OBGYN for years. Last pap 6-7 years ago. New partner x 2 years, open to STD testing.  NO bleeding. Some vaginal dryness/dyspareunia.  Hx HTN on lisinopril.    Postmenopausal GYN History    No LMP recorded.  STD history: none  Abnormal Pap: No  Last Pap result: *NML  No results found for: "DIAG", "SPECADEQ", "METHOD", "[id number]", "HPV16"  Vasomotor symptoms no  The patient is not using any HT.  The patient did not used HT in the past.    Mammogram Result (most recent):  MAM TOMO DIGITAL SCREEN AUGMENTED BILATERAL 08/05/2021    Narrative  **Final**      Reason for exam: Screening  (asymptomatic)      PROCEDURE:  MG Mammo Screen w/Imp Tomo Bilat - Bilateral: August 05, 2021 - Cerner ID: [id number]      Prior Study Comparison: 10/28/2015 Bilateral MG Mammo Screen w/Imp Tomo Bilat, [facility]. 08/22/2017 Bilateral MG Mammo Screen w/Imp Tomo Bilat, [facility]. 11/21/2019 Bilateral MG Mammo Screen w/Imp Tomo Bilat, [facility].    There are scattered fibroglandular densities.  No suspicious masses, calcifications, or architectural distortion. Bilateral retroglandular silicone  implants in place.    Impression  No suspicious mammographic finding.      BI-RADS: Benign (2) (Overall)      RECOMMENDATION:  Screening Mammogram in 1 year.    To Meet FDA/MQSA Regulations, a letter has been sent to the patient informing the patient of the findings.      Releasing Radiologist:   Jama Flavors JAMES-MD  Released Date and Time:  08/05/21 13:43    There are no preventive care reminders to display for this patient.    Sexual history:  She  reports being sexually active and has had  partner(s) who are female.     Medical conditions:    Surgical history confirmed with patient.  has a past surgical history that includes Cesarean section; Appendectomy; Femur fracture surgery; and Breast Implant Removal.           DEXA Result (most recent):  No results found for this or any previous visit from the past 3650 days.      Past Medical History:   Diagnosis Date    Anxiety     Carotid stenosis     carotid stenosis less than 50% on carotid dopplers done 10/2020    Cold sore     Encounter for screening colonoscopy 04/2021    [facility]-planned colonoscopy 04/28/2021-diverticula/internal hemorrhoids, o/w unremarkable. recheck 5 years per [facility]    Hearing aid worn     Hearing loss     hearing aides for tinnitus/hearing loss    High cholesterol     Screening mammogram for breast cancer 08/04/2021    benign mammo    Tinnitus      Past Surgical History:   Procedure Laterality Date    APPENDECTOMY      BREAST IMPLANT REMOVAL      CESAREAN SECTION      FEMUR FRACTURE SURGERY         Current Outpatient Medications  Medication Sig Dispense Refill    ALPRAZolam (XANAX) 0.25 MG tablet Take 1 tablet by mouth nightly as needed for Sleep for up to 120 days. Max Daily Amount: 0.25 mg 30 tablet 3    lisinopril (PRINIVIL;ZESTRIL) 5 MG tablet 1 tablet Orally Once a day for 30 day(s) 90 tablet 3    azelastine HCl 0.15 % SOLN 2 sprays in each nostril Nasally Once a day for 30 day(s) 11 mL 3    pantoprazole (PROTONIX) 40 MG tablet 1 tablet Orally Once a day for 30 day(s)      ezetimibe (ZETIA) 10 MG tablet 1 tablet Orally Once a day for 30 day(s) (Patient not taking: Reported on 04/19/2022) 30 tablet 5    fluticasone (FLONASE) 50 MCG/ACT nasal spray 1 spray in each nostril Nasally Once a day for 30 day(s) (Patient not taking: Reported on 11/01/2022)       No current facility-administered medications for this visit.       Allergies: Patient has no known allergies.     Tobacco History:  reports that she has never smoked. She has  never used smokeless tobacco.  Alcohol Abuse:  reports current alcohol use.  Drug Abuse:  reports no history of drug use.    Family Medical/Cancer History:   Family History   Problem Relation Age of Onset    Stroke Mother     Alcohol Abuse Father     Other Brother         non lymph hodgkins        Review of Systems   Constitutional:  Negative for fever.   HENT:  Negative for congestion.    Respiratory:  Negative for cough and shortness of breath.    Cardiovascular:  Negative for chest pain.   Gastrointestinal:  Negative for nausea and vomiting.   Genitourinary:  Negative for dyspareunia and pelvic pain.   Musculoskeletal:  Negative for arthralgias.   Skin:  Negative for rash.   Neurological:  Negative for dizziness.   Hematological:  Negative for adenopathy.         Physical Exam  Constitutional:       Appearance: Normal appearance.   HENT:      Head: Normocephalic and atraumatic.   Cardiovascular:      Rate and Rhythm: Normal rate and regular rhythm.   Pulmonary:      Effort: Pulmonary effort is normal.      Breath sounds: Normal breath sounds.   Chest:   Breasts:     Right: Normal. No mass, skin change or tenderness.      Left: Normal. No mass, skin change or tenderness.   Abdominal:      General: There is no distension.      Palpations: Abdomen is soft.      Tenderness: There is no abdominal tenderness.   Genitourinary:     General: Normal vulva.      Labia:         Right: No rash or tenderness.         Left: No rash or tenderness.       Urethra: No prolapse.      Cervix: No lesion or cervical bleeding.      Uterus: Normal.       Adnexa:         Right: No mass or tenderness.          Left: No mass or tenderness.        Rectum: Normal.  Comments: +Vaginal atrophy  Musculoskeletal:         General: Normal range of motion.      Cervical back: Neck supple.   Skin:     General: Skin is warm and dry.   Neurological:      General: No focal deficit present.      Mental Status: She is alert.   Psychiatric:          Mood and Affect: Mood normal.           Assessment and Plan:  1. Encounter for gynecological examination without abnormal finding  Exam overall benign. Discussed old history. Discussed vaginal estrogens. Wants to try estring. Rx sent. RTO 1 year to follow up/gyn exam only.  Pap today as it has been years and she has a new partner. Std testing on pap.        Return in about 2 years (around 11/01/2024) for annual.  Jacqualine Mau, MD

## 2022-11-07 LAB — PAP IG, CT-NG-TV, APTIMA HPV AND RFX 16/18,45 (199315)
.: 0
Chlamydia trachomatis, NAA: NEGATIVE
HPV Aptima: NEGATIVE
Neisseria Gonorrhoeae, NAA: NEGATIVE
Trichomonas Vaginalis by NAA: NEGATIVE

## 2022-11-16 ENCOUNTER — Ambulatory Visit: Admit: 2022-11-16 | Discharge: 2022-11-16 | Payer: MEDICARE | Attending: Family Medicine | Primary: Family Medicine

## 2022-11-16 ENCOUNTER — Encounter

## 2022-11-16 DIAGNOSIS — I10 Essential (primary) hypertension: Secondary | ICD-10-CM

## 2022-11-16 LAB — CBC WITH AUTO DIFFERENTIAL
Absolute Baso #: 0.1 10*3/uL (ref 0.0–0.2)
Absolute Eos #: 0.3 10*3/uL (ref 0.0–0.5)
Absolute Lymph #: 2.3 10*3/uL (ref 1.0–3.2)
Absolute Mono #: 0.5 10*3/uL (ref 0.3–1.0)
Basophils %: 1.1 % (ref 0.0–2.0)
Eosinophils %: 3.9 % (ref 0.0–7.0)
Hematocrit: 40.1 % (ref 34.0–47.0)
Hemoglobin: 13.5 g/dL (ref 11.5–15.7)
Immature Grans (Abs): 0 10*3/uL (ref 0.00–0.06)
Immature Granulocytes: 0 % (ref 0.0–0.6)
Lymphocytes: 32.1 % (ref 15.0–45.0)
MCH: 31.8 pg (ref 27.0–34.5)
MCHC: 33.7 g/dL (ref 32.0–36.0)
MCV: 94.4 fL (ref 81.0–99.0)
MPV: 11.1 fL (ref 7.2–13.2)
Monocytes: 6.8 % (ref 4.0–12.0)
NRBC Absolute: 0 10*3/uL (ref 0.000–0.012)
NRBC Automated: 0 % (ref 0.0–0.2)
Neutrophils %: 56.1 % (ref 42.0–74.0)
Neutrophils Absolute: 4.1 10*3/uL (ref 1.6–7.3)
Platelets: 251 10*3/uL (ref 140–440)
RBC: 4.25 x10e6/mcL (ref 3.60–5.20)
RDW: 12.8 % (ref 11.0–16.0)
WBC: 7.2 10*3/uL (ref 3.8–10.6)

## 2022-11-16 MED ORDER — PANTOPRAZOLE SODIUM 40 MG PO TBEC
40 MG | ORAL_TABLET | ORAL | 4 refills | Status: AC
Start: 2022-11-16 — End: 2023-04-26

## 2022-11-16 NOTE — Progress Notes (Signed)
Patient ID: Ashley Hogan is a 72 y.o. female.  Chief Complaint   Patient presents with    Referral - General     Memory and ringing in ears     Knee Pain     Referral to ortho      Pt with sig ringing in her ears, used to be at times, but now all the time.  Seems same all the time she says. Closing an ear doesn't seem to help with it.  Went to ENT prior, but not much that was able to be done. 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 Has declined to daily and throughout the day.  Seems more persistent and louder than prior.    Memory issues now as well, can be old or new things worsening over last few months per her/partner.  No particular med changes or other prior.  Right knee pain x months now starting to bother worse, had right hip surgery prior and thinks somewhat related to just walking differently but hurts most of time and affecting her gait lately      Past Medical History:   Diagnosis Date    Anxiety     Carotid stenosis     carotid stenosis less than 50% on carotid dopplers done 10/2020    Cold sore     Encounter for screening colonoscopy 04/2021    GI-[facility]-planned colonoscopy 04/28/2021-diverticula/internal hemorrhoids, o/w unremarkable. recheck 5 years per [facility]    Hearing aid worn     Hearing loss     hearing aides for tinnitus/hearing loss    High cholesterol     Screening mammogram for breast cancer 08/04/2021    benign mammo    Tinnitus       Past Surgical History:   Procedure Laterality Date    APPENDECTOMY      BREAST IMPLANT REMOVAL      CESAREAN SECTION      FEMUR FRACTURE SURGERY        Family History   Problem Relation Age of Onset    Stroke Mother     Alcohol Abuse Father     Other Brother         non lymph hodgkins      No Known Allergies   Social History     Tobacco Use    Smoking status: Never    Smokeless tobacco: Never   Vaping Use    Vaping Use: Never used   Substance Use  Topics    Alcohol use: Yes     Comment: 2-4 Times a month    Drug use: Never      Review of Systems   Constitutional:  Negative for chills and fever.   HENT:  Positive for tinnitus. Negative for congestion and sinus pain.    Respiratory:  Negative for cough and shortness of breath.    Cardiovascular:  Negative for chest pain, palpitations and leg swelling.   Gastrointestinal:  Negative for abdominal pain, nausea and vomiting.   Musculoskeletal:  Positive for arthralgias and gait problem.   Neurological:  Negative for dizziness, tremors, seizures, syncope, weakness, light-headedness and headaches.   Psychiatric/Behavioral:          Memory loss     Patient Care Team:  Harvest Dark, MD as PCP - General  Harvest Dark, MD as PCP - Empaneled Provider  Gourdin, Janith Lima, MD as  Surgeon (Gastroenterology)  Ulis Rias, Jasper Loser, MD as Consulting Physician (Otolaryngology)  Lucianne Muss, MD (Neurology)  Crantford, Brooke Bonito, MD as Surgeon (Plastic Surgery)  Ashwander, Grace Bushy, MD as Consulting Physician (Vascular Surgery)  Jacqualine Mau, MD as Consulting Physician (Obstetrics & Gynecology)   Current Outpatient Medications   Medication Sig Dispense Refill    pantoprazole (PROTONIX) 40 MG tablet 1 tablet Orally Once a day for 30 day(s) 30 tablet 4    ALPRAZolam (XANAX) 0.25 MG tablet Take 1 tablet by mouth nightly as needed for Sleep for up to 120 days. Max Daily Amount: 0.25 mg 30 tablet 3    lisinopril (PRINIVIL;ZESTRIL) 5 MG tablet 1 tablet Orally Once a day for 30 day(s) 90 tablet 3    azelastine HCl 0.15 % SOLN 2 sprays in each nostril Nasally Once a day for 30 day(s) 11 mL 3    Estradiol (ESTRING) 7.5 MCG/24HR RING Place 1 each vaginally once for 1 dose Change every 3 months (Patient not taking: Reported on 11/16/2022) 1 each 4     No current facility-administered medications for this visit.      PHQ-9 Total Score: 0 (11/16/2022 12:06 PM)     Objective:   No results found for: "CBC", "CMP",  "LIPIDPAN", "TSH", "HBA1C", "VITD25OHEXT"    BP 132/70 (11/16/22 1243)    Temp      Pulse 54 (11/16/22 1206)   Resp 16 (11/16/22 1206)    SpO2 98 % (11/16/22 1206)     Body mass index is 27.25 kg/m.  Lab Results   Component Value Date    WBC 6.9 04/19/2022    HGB 13.1 04/19/2022    HCT 40.6 04/19/2022    MCV 97.4 04/19/2022    PLT 241 04/19/2022     Lab Results   Component Value Date    NA 139 04/19/2022    K 4.9 04/19/2022    CL 103 04/19/2022    CO2 25 04/19/2022    BUN 16 04/19/2022    CREATININE 0.8 04/19/2022    GLUCOSE 83 04/19/2022    CALCIUM 9.3 04/19/2022    PROT 6.7 04/19/2022    LABALBU 4.7 04/19/2022    BILITOT 0.21 04/19/2022    ALKPHOS 56 04/19/2022    AST 21 04/19/2022    ALT 14 04/19/2022    LABGLOM 78 04/19/2022    AGRATIO 2.40 04/19/2022    GLOB 2.0 04/19/2022     Hemoglobin A1C   Date Value Ref Range Status   04/19/2022 5.2 4.0 - 6.0 % Final     Comment:     HEMOGLOBIN A1C INTERPRETATION:    The following arbitrary ranges may be used for interpretation of the results.  However, factors such as duration of diabetes, adherence to therapy, and  patient age should also be considered in assessing degree of blood glucose  control.    Hemoglobin A1C                 Avg. Blood Sugar  --------------------------------------------------------------  6%                           135 mg/dL  7%                           170 mg/dL  8%  205 mg/dL  9%                           240 mg/dL  10%                          275 mg/dL    ======================================================    A1C                      Glucose Control  ----------------------------------------------------------------  < 6.0 %                   Normal  6.0 - 6.9 %               Abnormal  7.0 - 7.9 %               Sub-Optimal Control  > 8.0 %                   Inadequate Control       Lab Results   Component Value Date    CHOL 277 (H) 04/19/2022    CHOL 277 (H) 04/18/2021     Lab Results   Component Value Date     TRIG 171 (H) 04/19/2022    TRIG 177 (H) 04/18/2021     Lab Results   Component Value Date    HDL 64 04/19/2022    HDL 63 04/18/2021     Lab Results   Component Value Date    LDLCHOLESTEROL 178.8 (H) 04/19/2022    LDLCHOLESTEROL 178.6 (H) 04/18/2021     Lab Results   Component Value Date    VLDL 34.2 04/19/2022    VLDL 35.4 04/18/2021     Lab Results   Component Value Date    CHOLHDLRATIO 4.3 04/19/2022    CHOLHDLRATIO 4.4 04/18/2021     No results found for: "TSHFT4", "TSH", "TSHREFLEX", "TSH3GEN"  No results found for: "PSA", "PSADIA"    Physical Exam  Constitutional:       General: She is not in acute distress.     Appearance: Normal appearance. She is not ill-appearing.   HENT:      Head: Normocephalic and atraumatic.      Right Ear: Tympanic membrane normal.      Left Ear: Tympanic membrane normal.      Ears:      Comments: Poor lite reflex, no tragal ttp  Eyes:      Extraocular Movements: Extraocular movements intact.      Pupils: Pupils are equal, round, and reactive to light.   Neck:      Vascular: No carotid bruit.   Cardiovascular:      Rate and Rhythm: Normal rate and regular rhythm.      Pulses: Normal pulses.      Heart sounds: Normal heart sounds. No murmur heard.  Pulmonary:      Effort: Pulmonary effort is normal. No respiratory distress.      Breath sounds: Normal breath sounds. No wheezing.   Abdominal:      General: Bowel sounds are normal. There is no distension.      Palpations: Abdomen is soft.      Tenderness: There is no abdominal tenderness.   Musculoskeletal:      Comments: Gait slowed and favors right side somewhat   Skin:     General: Skin is warm.   Neurological:  General: No focal deficit present.      Mental Status: She is alert. Mental status is at baseline. She is disoriented.      Cranial Nerves: No cranial nerve deficit.      Sensory: No sensory deficit.      Motor: No weakness.      Coordination: Coordination normal.      Deep Tendon Reflexes: Reflexes normal.      Comments:  Normal finger nose and b/l symmetric dtr noted.  3/3 items recalled at 1 min, 0 at 54mn but with one word prompt she got all 3.  Normal clock face.  Normal spelling of world backwards. Aaox 3   Psychiatric:         Mood and Affect: Mood normal.         Behavior: Behavior normal.       No results found for this visit on 11/16/22.   Assessment:      Encounter Diagnoses   Name Primary?    Primary hypertension Yes    Statin intolerance     Memory loss     Chronic pain of right knee     Tinnitus of both ears     Stenosis of right carotid artery     Heartburn     Screening for HIV (human immunodeficiency virus)           Plan:    Htn-Pt doing well on current medications, tolerating w/o sig side effects. Refilled meds if needed at this time. Reviewed briefly Dash diet and exercise/stress reduction techniques if applicable recheck ok. Continue  Off chol meds due to intolerance.  Has carotid stenosis and may need more vascular review again as well.  Repeat mri now-has been 3 years.  Suggestion of BIHtn on prior mri, but not on meds-may want to try diamox in future, but no hx or vision changes, just the tinnitus and memory bothering.  Mri for memory as well and labs to look for possible treatable causes now.   Heartburn/gerd-reviewed, continue medications as listed.  Will refill if needed by patient.  Reflux precautions reviewed: 1. Avoid trigger foods 2. Eat slowly and chew well with each bite. 3. No laying down or exercises immediately after eating for at least an hour-stay upright in seated position for an hour if able. 4. Elevate head of bed with bricks or wedge pillow so that the head of bed is elevated 4-6 inches to help at bedtime keep reflux from the esophagus.  Knee pain-referred to ortho to help as well.  Time-Of note, spent-...41 minutes today in review of patient hx and exam and with patient in review of their issues, medical problems, treatment options/methods, side effects, and risks if applicable (pros/cons),  as well as with necessary charting on day of visit encounter.  I confirm that I have managed the broad scope of the patient's health needs by furnishing care for some or all of the patient's acute and/or chronic conditions across a spectrum of diagnoses and organ systems that will require ongoing care with myself or someone on my team.    Rtc in 6-8 weeks for f/u on issues to see how doing again then and make sure have more defined plan for tx  Orders Placed This Encounter    MRI BRAIN W WO CONTRAST     Standing Status:   Future     Standing Expiration Date:   11/17/2023     Order Specific Question:   STAT Creatinine as needed:  Answer:   No     Order Specific Question:   Reason for exam:     Answer:   worsening tinnitus and memory loss     Order Specific Question:   Release to patient - Note: Delayed release will only apply to this order. Orders that are changed or resulted outside of the EHR will not respect a delayed release to MyChart.     Answer:   Delay Immediate Release by 3 Days     Order Specific Question:   Reason for preventing immediate release     Answer:   Likely risk of substantial harm [id number]    CBC with Auto Differential     Standing Status:   Future     Standing Expiration Date:   05/16/2024    Comprehensive Metabolic Panel     Standing Status:   Future     Standing Expiration Date:   05/16/2024    Magnesium     Standing Status:   Future     Standing Expiration Date:   05/16/2024    TSH with Reflex     Standing Status:   Future     Standing Expiration Date:   05/16/2024    T4, Free     Standing Status:   Future     Standing Expiration Date:   05/16/2024    Vitamin B12     Standing Status:   Future     Standing Expiration Date:   05/16/2024    HIV Screen     Standing Status:   Future     Standing Expiration Date:   05/16/2024    RPR     Standing Status:   Future     Standing Expiration Date:   05/16/2024    [facility] - Cathlean Sauer MD, Orthopaed[facility]     Referral Priority:   Routine      Referral Type:   Eval and Treat     Referral Reason:   Specialty Services Required     Referred to Provider:   Cathlean Sauer, MD     Requested Specialty:   Orthopedic Surgery     Number of Visits Requested:   1    Verlin Fester, MD - Otolaryngology     Referral Priority:   Routine     Referral Type:   Eval and Treat     Referral Reason:   Specialty Services Required     Referred to Provider:   Janeth Rase, MD     Requested Specialty:   Otolaryngology     Number of Visits Requested:   1    AMB POC URINALYSIS DIP STICK AUTO W/O MICRO    pantoprazole (PROTONIX) 40 MG tablet     Sig: 1 tablet Orally Once a day for 30 day(s)     Dispense:  30 tablet     Refill:  4            Harvest Dark, MD

## 2022-11-17 LAB — COMPREHENSIVE METABOLIC PANEL
ALT: 17 U/L (ref 0–35)
AST: 25 U/L (ref 0–35)
Albumin/Globulin Ratio: 1.9 (ref 1.00–2.70)
Albumin: 4.6 g/dL (ref 3.5–5.2)
Alk Phosphatase: 62 U/L (ref 35–117)
Anion Gap: 12 mmol/L (ref 2–17)
BUN: 18 mg/dL (ref 8–23)
CO2: 27 mmol/L (ref 22–29)
Calcium: 9.7 mg/dL (ref 8.8–10.2)
Chloride: 101 mmol/L (ref 98–107)
Creatinine: 0.8 mg/dL (ref 0.5–1.0)
Est, Glom Filt Rate: 78 mL/min/1.73m (ref >=60)
Globulin: 2.4 g/dL (ref 1.9–4.4)
Glucose: 84 mg/dL (ref 70–99)
OSMOLALITY CALCULATED: 280 mOsm/kg (ref 270–287)
Potassium: 4.3 mmol/L (ref 3.5–5.3)
Sodium: 140 mmol/L (ref 135–145)
Total Bilirubin: 0.42 mg/dL (ref 0.00–1.20)
Total Protein: 7 g/dL (ref 6.4–8.3)

## 2022-11-17 LAB — MAGNESIUM: Magnesium: 2.3 mg/dL (ref 1.6–2.6)

## 2022-11-17 LAB — T4, FREE: T4 Free: 1.06 ng/dL (ref 0.82–1.70)

## 2022-11-17 LAB — HIV SCREEN: HIV Screen: NEGATIVE

## 2022-11-17 LAB — VITAMIN B12: Vitamin B-12: 624 pg/mL (ref 232–1245)

## 2022-11-17 LAB — TSH WITH REFLEX: TSH: 1.7 mcIU/mL (ref 0.358–3.740)

## 2022-11-20 LAB — RPR: RPR: NONREACTIVE

## 2022-11-23 ENCOUNTER — Encounter: Payer: MEDICARE | Attending: Orthopaedic Surgery | Primary: Family Medicine

## 2022-11-27 ENCOUNTER — Ambulatory Visit: Admit: 2022-11-27 | Discharge: 2022-11-27 | Payer: MEDICARE | Primary: Family Medicine

## 2022-11-27 ENCOUNTER — Ambulatory Visit: Admit: 2022-11-27 | Discharge: 2022-11-27 | Payer: MEDICARE | Attending: Orthopaedic Surgery | Primary: Family Medicine

## 2022-11-27 DIAGNOSIS — R52 Pain, unspecified: Secondary | ICD-10-CM

## 2022-11-27 NOTE — Progress Notes (Signed)
Ashley Hogan (DOB:  [birth year]) is a 72 y.o. female,New patient, here for evaluation of the following chief complaint(s):  New Patient and Knee Pain (Had an injury to Rt Femur and had rod placed 20 yrs ago. Dr. Rockey Situ her the rod he used was to short. Now she is having knee pain. Pain level 6-7. Feels like the knee gives out at time. )          Subjective   SUBJECTIVE/OBJECTIVE:  HPI  The patient is a very interesting 72 year old female who complains of right hip and knee pain.  She tells me about 20 years ago she fell on her right hip and sustained a fracture of her femur and was treated with a femoral rod.  She was told at the time by the trauma surgeon that he used the "longest rod I had" she is also having giving way of her knee with tenderness in the area of the patellofemoral joint really more proximal patella than distal patella.  She denies any trauma to the knee itself.  She was just worried by the fact that when she had her surgery done the trauma surgeon told her that she may have pain in the future and that she should pay attention to this and now that she is having more pain in the hip and knee she is a little concerned about possible deterioration of this hip.  We did up getting x-rays of both the hip and the knee.  Originally we had x-rays of the knee thinking that the rod extended down into the femur area but apparently this was some type of short rod and x-rays of the hip show that she did have a short rod with at least 2 screws distally and 2 cables more proximally.  It looks like she had a long oblique fracture and the short rod barely covered the fracture itself but she is healed it wonderfully.  I do not see any signs of avascular necrosis or arthritic changes into the hip joint proper.  This is certainly something I would continue to watch and I would not be concerned about it.  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  There is  a little bit of spurring in the knee joint at the area of the superior pole the patella.  No obvious signs of any arthritic changes in the weightbearing portions of the knee.  After some discussion it sounds like the patella is giving way on her and it sounds like she may have some early patellofemoral arthritic changes per x-ray.  We offered her corticosteroid injection but she remembers a corticosteroid injection she had in her finger and she tells me that was so painful she did not want to consider an injection.  I reassured her that it would not be as significant as that shot in her finger but certainly understand her hesitance to consider another injection.  At this point in time I do not think she "needs it".  Happy to see her back if the pain recurs or becomes more     Chief Complaint   Patient presents with    New Patient    Knee Pain     Had an injury to Rt Femur and had rod placed 20 yrs ago. Dr. Rockey Situ her the rod he used was to short. Now she is having knee pain. Pain level 6-7. Feels like the knee gives out at  time.       No Known Allergies  Current Outpatient Medications   Medication Sig Dispense Refill    pantoprazole (PROTONIX) 40 MG tablet 1 tablet Orally Once a day for 30 day(s) 30 tablet 4    Estradiol (ESTRING) 7.5 MCG/24HR RING Place 1 each vaginally once for 1 dose Change every 3 months (Patient not taking: Reported on 11/16/2022) 1 each 4    ALPRAZolam (XANAX) 0.25 MG tablet Take 1 tablet by mouth nightly as needed for Sleep for up to 120 days. Max Daily Amount: 0.25 mg 30 tablet 3    lisinopril (PRINIVIL;ZESTRIL) 5 MG tablet 1 tablet Orally Once a day for 30 day(s) 90 tablet 3    azelastine HCl 0.15 % SOLN 2 sprays in each nostril Nasally Once a day for 30 day(s) 11 mL 3     No current facility-administered medications for this visit.     Past Medical History:   Diagnosis Date    Anxiety     Carotid stenosis     carotid stenosis less than 50% on carotid dopplers done 10/2020    Cold sore      Encounter for screening colonoscopy 04/2021    [facility]-planned colonoscopy 04/28/2021-diverticula/internal hemorrhoids, o/w unremarkable. recheck 5 years per [facility]    Hearing aid worn     Hearing loss     hearing aides for tinnitus/hearing loss    High cholesterol     Screening mammogram for breast cancer 08/04/2021    benign mammo    Tinnitus      Past Surgical History:   Procedure Laterality Date    APPENDECTOMY      BREAST IMPLANT REMOVAL      CESAREAN SECTION      FEMUR FRACTURE SURGERY       Family History   Problem Relation Age of Onset    Stroke Mother     Alcohol Abuse Father     Other Brother         non lymph hodgkins     Social History     Tobacco Use    Smoking status: Never    Smokeless tobacco: Never   Vaping Use    Vaping Use: Never used   Substance Use Topics    Alcohol use: Yes     Comment: 2-4 Times a month    Drug use: Never     Social History     Occupational History    Not on file   Tobacco Use    Smoking status: Never    Smokeless tobacco: Never   Vaping Use    Vaping Use: Never used   Substance and Sexual Activity    Alcohol use: Yes     Comment: 2-4 Times a month    Drug use: Never    Sexual activity: Yes     Partners: Male     Social History     Tobacco Use   Smoking Status Never   Smokeless Tobacco Never        reports current alcohol use.     ROS   No fever  no hx of acute wt loss  denies constitutional  problems  no abnormal swelling in any other joints all other systems negative          Objective   Physical Exam: Mild patellofemoral pain with full range of motion of the knee.  Open slightly on a varus stress laterally.  No lateral joint line tenderness however.  Negative  anterior drawer.  No effusion on the knee joint proper.  Excellent internal and external rotation of the hip with no increasing pain on internal and external rotation of the hip.  No crepitance.  Mild tenderness laterally at the trochanteric bursa          ASSESSMENT/PLAN:  1. 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  There is some mild spurring at the superior aspect of the patellofemoral joint.  X-rays of the hip show a well-healed fracture of the proximal femur that was treated with cables and an intramedullary nail that barely past the area of the fracture site.  Fracture however healed well with no obvious signs of complication.  Patient weightbears as tolerated.  She does not limp at all.  There is no leg length discrepancy.  Good range of motion to the hip.  Good range of motion to the knee.  The next step would be a consideration for a corticosteroid injection in the knee but currently she does not feel like she desires that.  I am happy to see her back for any other reason  No follow-ups on file.         An electronic signature was used to authenticate this note.    --Cathlean Sauer, MD

## 2022-11-30 ENCOUNTER — Inpatient Hospital Stay: Admit: 2022-11-30 | Payer: MEDICARE | Attending: Family Medicine | Primary: Family Medicine

## 2022-11-30 DIAGNOSIS — R413 Other amnesia: Secondary | ICD-10-CM

## 2022-11-30 MED ORDER — GADOTERIDOL 279.3 MG/ML IV SOLN
279.3 | Freq: Once | INTRAVENOUS | Status: AC | PRN
Start: 2022-11-30 — End: 2022-11-30
  Administered 2022-11-30: 23:00:00 20 mL via INTRAVENOUS

## 2022-12-01 ENCOUNTER — Telehealth

## 2022-12-01 NOTE — Telephone Encounter (Signed)
Patient said that she had a MRI done has a sinus inf would like something called for her sinus inf

## 2022-12-04 MED ORDER — AMOXICILLIN 875 MG PO TABS
875 | ORAL_TABLET | Freq: Two times a day (BID) | ORAL | 0 refills | Status: AC
Start: 2022-12-04 — End: 2022-12-14

## 2022-12-04 NOTE — Telephone Encounter (Signed)
Ok, sent to [facility] for her now with 10 days of amoxicillin.  thx

## 2023-01-11 ENCOUNTER — Ambulatory Visit
Admit: 2023-01-11 | Discharge: 2023-01-11 | Payer: PRIVATE HEALTH INSURANCE | Attending: Family Medicine | Primary: Family Medicine

## 2023-01-11 DIAGNOSIS — R413 Other amnesia: Secondary | ICD-10-CM

## 2023-01-11 MED ORDER — LISINOPRIL-HYDROCHLOROTHIAZIDE 10-12.5 MG PO TABS
10-12.5 MG | ORAL_TABLET | Freq: Every day | ORAL | 3 refills | Status: AC
Start: 2023-01-11 — End: 2023-04-26

## 2023-01-11 MED ORDER — ALPRAZOLAM 0.25 MG PO TABS
0.25 MG | ORAL_TABLET | Freq: Every evening | ORAL | 3 refills | Status: DC | PRN
Start: 2023-01-11 — End: 2023-11-29

## 2023-01-11 MED ORDER — FLUOXETINE HCL 10 MG PO CAPS
10 MG | ORAL_CAPSULE | Freq: Every day | ORAL | 3 refills | Status: AC
Start: 2023-01-11 — End: 2023-04-26

## 2023-01-11 NOTE — Progress Notes (Signed)
Patient ID: Ashley Hogan is a 72 y.o. female.  Chief Complaint   Patient presents with    Follow-up    Insomnia    Discuss Medications     Increased lisinopril on her own      Here for f/u on issues...  Recently did [facility] screening and noted higher bp readings.  She increased the lisinopril to 10mg  since and thinks has been helping some.    Sig other is away in [place] and that has her stressed.  Daughter getting married in a month and some stresses related to that and her ex-husband getting in picture is stressful.   Mri looking for memory issues looked ok other than the sinus.  Abx helped with the sinus portions of things.  Keeps some allergies, but has been harder for her at times of year-using astepro with some help  Still feels like she had trouble remembering things still.  Not as good as prior.  Tinnitus she feels like is louder for her. Uses occ xanax to help with rest/sleep-normally does enough to help her rest.   Tinnitus bad even during day and not easily drowned out by other things at this point. Has seen ENT, but couldn't help much really. Has hearing aides, but doesn't really help this that much overall she says.   Some off balance feelings at times.   In past did well with prozac for a spell with her stress and mood.  Helped in past.     Insomnia  Pertinent negatives include no abdominal pain, chest pain, chills, coughing, fever, nausea or vomiting.     Past Medical History:   Diagnosis Date    Anxiety     Carotid stenosis     carotid stenosis less than 50% on carotid dopplers done 10/2020    Cold sore     Encounter for screening colonoscopy 04/2021    GI-Gourdin-planned colonoscopy 04/28/2021-diverticula/internal hemorrhoids, o/w unremarkable. recheck 5 years per Gourdin    H/O magnetic resonance imaging of brain and brain stem 12/01/2022    Whole brain imaging shows no acute abnormality. 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 Fluid level in the right maxillary sinus,  nonspecific but could reflect acute  sinusitis in the appropriate clinical setting.    Hearing aid worn     Hearing loss     hearing aides for tinnitus/hearing loss    High cholesterol     Screening mammogram for breast cancer 08/04/2021    benign mammo    Tinnitus       Past Surgical History:   Procedure Laterality Date    APPENDECTOMY      BREAST IMPLANT REMOVAL      CESAREAN SECTION      FEMUR FRACTURE SURGERY        Family History   Problem Relation Age of Onset    Stroke Mother     Alcohol Abuse Father     Other Brother         non lymph hodgkins      No Known Allergies   Social History     Tobacco Use    Smoking status: Never    Smokeless tobacco: Never   Vaping Use    Vaping Use: Never used   Substance Use Topics    Alcohol use: Yes     Comment: 2-4 Times a month    Drug use: Never      Review of Systems   Constitutional:  Negative  for chills and fever.   HENT:  Positive for tinnitus.    Respiratory:  Negative for cough and shortness of breath.    Cardiovascular:  Negative for chest pain, palpitations and leg swelling.   Gastrointestinal:  Negative for abdominal pain, nausea and vomiting.   Psychiatric/Behavioral:  Positive for decreased concentration and sleep disturbance. The patient has insomnia.      Patient Care Team:  Mayme Genta, MD as PCP - General  Mayme Genta, MD as PCP - Empaneled Provider  Gourdin, Jae Dire, MD as Surgeon (Gastroenterology)  Doristine Johns, Oneal Deputy, MD as Consulting Physician (Otolaryngology)  Haze Boyden, MD (Neurology)  Crantford, Brynda Peon, MD as Surgeon (Plastic Surgery)  Ashwander, Vilinda Blanks, MD as Consulting Physician (Vascular Surgery)  Belia Heman, MD as Consulting Physician (Obstetrics & Gynecology)   Current Outpatient Medications   Medication Sig Dispense Refill    ALPRAZolam Prudy Feeler) 0.25 MG tablet Take 1 tablet by mouth nightly as needed for Sleep for up to 120 days. Max Daily Amount: 0.25 mg 30 tablet 3    lisinopril-hydroCHLOROthiazide  (PRINZIDE;ZESTORETIC) 10-12.5 MG per tablet Take 1 tablet by mouth daily 30 tablet 3    FLUoxetine (PROZAC) 10 MG capsule Take 1 capsule by mouth daily 30 capsule 3    pantoprazole (PROTONIX) 40 MG tablet 1 tablet Orally Once a day for 30 day(s) 30 tablet 4    lisinopril (PRINIVIL;ZESTRIL) 5 MG tablet 1 tablet Orally Once a day for 30 day(s) (Patient taking differently: Take 2 tablets by mouth daily 1 tablet Orally Once a day for 30 day(s)) 90 tablet 3    azelastine HCl 0.15 % SOLN 2 sprays in each nostril Nasally Once a day for 30 day(s) 11 mL 3    Estradiol (ESTRING) 7.5 MCG/24HR RING Place 1 each vaginally once for 1 dose Change every 3 months (Patient not taking: Reported on 11/16/2022) 1 each 4     No current facility-administered medications for this visit.      PHQ-9 Total Score: 2 (01/11/2023  1:53 PM)     Objective:   No results found for: "CBC", "CMP", "LIPIDPAN", "TSH", "HBA1C", "VITD25OHEXT"    BP (!) 142/78 (01/11/23 1414)    Temp      Pulse 51 (01/11/23 1353)   Resp 16 (01/11/23 1353)    SpO2 97 % (01/11/23 1353)     Body mass index is 27.64 kg/m.  Lab Results   Component Value Date    WBC 7.2 11/16/2022    HGB 13.5 11/16/2022    HCT 40.1 11/16/2022    MCV 94.4 11/16/2022    PLT 251 11/16/2022     Lab Results   Component Value Date    NA 140 11/16/2022    K 4.3 11/16/2022    CL 101 11/16/2022    CO2 27 11/16/2022    BUN 18 11/16/2022    CREATININE 0.8 11/16/2022    GLUCOSE 84 11/16/2022    CALCIUM 9.7 11/16/2022    PROT 7.0 11/16/2022    LABALBU 4.6 11/16/2022    BILITOT 0.42 11/16/2022    ALKPHOS 62 11/16/2022    AST 25 11/16/2022    ALT 17 11/16/2022    LABGLOM 78 11/16/2022    AGRATIO 1.90 11/16/2022    GLOB 2.4 11/16/2022     Hemoglobin A1C   Date Value Ref Range Status   04/19/2022 5.2 4.0 - 6.0 % Final     Comment:     HEMOGLOBIN A1C INTERPRETATION:  The following arbitrary ranges may be used for interpretation of the results.  However, factors such as duration of diabetes, adherence to therapy,  and  patient age should also be considered in assessing degree of blood glucose  control.    Hemoglobin A1C                 Avg. Blood Sugar  --------------------------------------------------------------  6%                           135 mg/dL  7%                           170 mg/dL  8%                           205 mg/dL  9%                           240 mg/dL  09%                          275 mg/dL    ======================================================    A1C                      Glucose Control  ----------------------------------------------------------------  < 6.0 %                   Normal  6.0 - 6.9 %               Abnormal  7.0 - 7.9 %               Sub-Optimal Control  > 8.0 %                   Inadequate Control       Lab Results   Component Value Date    CHOL 277 (H) 04/19/2022    CHOL 277 (H) 04/18/2021     Lab Results   Component Value Date    TRIG 171 (H) 04/19/2022    TRIG 177 (H) 04/18/2021     Lab Results   Component Value Date    HDL 64 04/19/2022    HDL 63 04/18/2021     Lab Results   Component Value Date    LDLCHOLESTEROL 178.8 (H) 04/19/2022    LDLCHOLESTEROL 178.6 (H) 04/18/2021     Lab Results   Component Value Date    VLDL 34.2 04/19/2022    VLDL 35.4 04/18/2021     Lab Results   Component Value Date    CHOLHDLRATIO 4.3 04/19/2022    CHOLHDLRATIO 4.4 04/18/2021     Lab Results   Component Value Date    TSHREFLEX 1.700 11/16/2022     No results found for: "PSA", "PSADIA"    Physical Exam  Constitutional:       General: She is not in acute distress.     Appearance: Normal appearance. She is not ill-appearing or toxic-appearing.   HENT:      Head: Normocephalic and atraumatic.      Right Ear: External ear normal.      Left Ear: External ear normal.   Eyes:      Extraocular Movements: Extraocular movements intact.      Conjunctiva/sclera: Conjunctivae normal.   Neck:  Vascular: No carotid bruit.   Cardiovascular:      Rate and Rhythm: Normal rate and regular rhythm.      Pulses: Normal  pulses.      Heart sounds: Normal heart sounds. No murmur heard.  Pulmonary:      Effort: Pulmonary effort is normal.      Breath sounds: Normal breath sounds. No wheezing.   Abdominal:      General: Bowel sounds are normal. There is no distension.      Palpations: Abdomen is soft.      Tenderness: There is no abdominal tenderness.   Neurological:      General: No focal deficit present.      Mental Status: She is alert and oriented to person, place, and time. Mental status is at baseline.   Psychiatric:         Mood and Affect: Mood normal.         Behavior: Behavior normal.       No results found for this visit on 01/11/23.   Assessment:      Encounter Diagnoses   Name Primary?    Memory loss Yes    Ethmoid sinusitis, unspecified chronicity     Primary hypertension     Chronic pain of right knee     Pure hypercholesterolemia     Statin intolerance     Insomnia, unspecified type     Stress     Anxiety           Plan:    Start fluoxetine for stress/anxiety and may help a little on memory that could be related to anxiety/focus issues.  Refill med for help with sleep that she uses sparingly.  Htn-Pt doing well on current medications, tolerating w/o sig side effects. Refilled meds if needed at this time. Reviewed briefly Dash diet and exercise/stress reduction techniques if applicable will increase to prinzide now to get a little better and see if helps the tinnitus a bit.  Off meds for cholesterol but continue mediterranean style diet  Right knee pain seen by ortho, will consider f/u if needed. Overall keeping active and trying to do what she can. Consider injection if worsens.  Insomnia-Proper sleep hygiene changes take a few weeks to get significantly better-please be patient.  Set routine sleep/wake times and follow them daily as closely as possible.  No tv or reading in bed at all if having trouble sleeping. Can read/watch elsewhere.  Bedroom should only be for sleep or sex o/w.  Ok to use sound machines or apps  for white noise or pink noise.  Dark/cool and generally quiet rooms work best for sleep.  No exercise within 2-3 hours of bedtime. No caffeine after lunch time. Stop work at least 2-3 hours prior to bed as well and no screens for an hour prior to bedtime.  If able limit eating near bedtime as well.  If in bed but can't get to sleep after 20 minutes, get up and go do something quiet and not activating in another room and come back/try again as getting sleepy.  Rtc as planned in summer and labs then for changes.  Orders Placed This Encounter    ALPRAZolam (XANAX) 0.25 MG tablet     Sig: Take 1 tablet by mouth nightly as needed for Sleep for up to 120 days. Max Daily Amount: 0.25 mg     Dispense:  30 tablet     Refill:  3    lisinopril-hydroCHLOROthiazide (PRINZIDE;ZESTORETIC) 10-12.5 MG  per tablet     Sig: Take 1 tablet by mouth daily     Dispense:  30 tablet     Refill:  3    FLUoxetine (PROZAC) 10 MG capsule     Sig: Take 1 capsule by mouth daily     Dispense:  30 capsule     Refill:  3            Mayme Genta, MD

## 2023-02-20 ENCOUNTER — Inpatient Hospital Stay: Admit: 2023-02-20 | Payer: MEDICARE | Attending: Family Medicine | Primary: Family Medicine

## 2023-02-20 DIAGNOSIS — Z1231 Encounter for screening mammogram for malignant neoplasm of breast: Secondary | ICD-10-CM

## 2023-02-20 DIAGNOSIS — Z780 Asymptomatic menopausal state: Secondary | ICD-10-CM

## 2023-04-26 ENCOUNTER — Encounter

## 2023-04-26 ENCOUNTER — Ambulatory Visit: Admit: 2023-04-26 | Discharge: 2023-04-26 | Payer: MEDICARE | Attending: Family Medicine | Primary: Family Medicine

## 2023-04-26 DIAGNOSIS — Z0000 Encounter for general adult medical examination without abnormal findings: Secondary | ICD-10-CM

## 2023-04-26 MED ORDER — FLUOXETINE HCL 10 MG PO CAPS
10 | ORAL_CAPSULE | Freq: Every day | ORAL | 3 refills | Status: AC
Start: 2023-04-26 — End: ?

## 2023-04-26 MED ORDER — LISINOPRIL-HYDROCHLOROTHIAZIDE 10-12.5 MG PO TABS
10-12.5 MG | ORAL_TABLET | Freq: Every day | ORAL | 11 refills | Status: DC
Start: 2023-04-26 — End: 2023-09-19

## 2023-04-26 MED ORDER — ESTRING 2 MG VA RING
2 MG | Freq: Once | VAGINAL | 4 refills | Status: DC
Start: 2023-04-26 — End: 2023-09-19

## 2023-04-26 NOTE — Patient Instructions (Signed)
Advance Directives: Care Instructions  Overview  An advance directive is a legal way to state your wishes at the end of your life. It tells your family and your doctor what to do if you can't say what you want.  There are two main types of advance directives. You can change them any time your wishes change.  Living will.  This form tells your family and your doctor your wishes about life support and other treatment. The form is also called a declaration.  Medical power of attorney.  This form lets you name a person to make treatment decisions for you when you can't speak for yourself. This person is called a health care agent (health care proxy, health care surrogate). The form is also called a durable power of attorney for health care.  If you do not have an advance directive, decisions about your medical care may be made by a family member, or by a doctor or a judge who doesn't know you.  It may help to think of an advance directive as a gift to the people who care for you. If you have one, they won't have to make tough decisions by themselves.  For more information, including forms for your state, see the [employer] website ([employer]cy).  Follow-up care is a key part of your treatment and safety. Be sure to make and go to all appointments, and call your doctor if you are having problems. It's also a good idea to know your test results and keep a list of the medicines you take.  What should you include in an advance directive?  Many states have a unique advance directive form. (It may ask you to address specific issues.) Or you might use a universal form that's approved by many states.  If your form doesn't tell you what to address, it may be hard to know what to include in your advance directive. Use the questions below to help you get started.  Who do you want to make decisions about your medical care if you are not able to?  What life-support measures do you want if you  have a serious illness that gets worse over time or can't be cured?  What are you most afraid of that might happen? (Maybe you're afraid of having pain, losing your independence, or being kept alive by machines.)  Where would you prefer to die? (Your home? A hospital? A nursing home?)  Do you want to donate your organs when you die?  Do you want certain religious practices performed before you die?  When should you call for help?  Be sure to contact your doctor if you have any questions.  Where can you learn more?  Go to [employer] and enter R264 to learn more about "Advance Directives: Care Instructions."  Current as of: August 17, 2022  Content Version: 14.1   2006-2024 [employer].   Care instructions adapted under license by [facility]. If you have questions about a medical condition or this instruction, always ask your healthcare professional. [employer] disclaims any warranty or liability for your use of this information.           A Healthy Heart: Care Instructions  Overview     Coronary artery disease, also called heart disease, occurs when a substance called plaque builds up in the vessels that supply oxygen-rich blood to your heart muscle. This can narrow the blood vessels and reduce blood flow. A heart attack happens when blood flow is completely blocked.  A high-fat diet, smoking, and other factors increase the risk of heart disease.  Your doctor has found that you have a chance of having heart disease. A heart-healthy lifestyle can help keep your heart healthy and prevent heart disease. This lifestyle includes eating healthy, being active, staying at a weight that's healthy for you, and not smoking or using tobacco. It also includes taking medicines as directed, managing other health conditions, and trying to get a healthy amount of sleep.  Follow-up care is a key part of your treatment and safety. Be sure to make and go to all appointments, and  call your doctor if you are having problems. It's also a good idea to know your test results and keep a list of the medicines you take.  How can you care for yourself at home?  Diet   Use less salt when you cook and eat. This helps lower your blood pressure. Taste food before salting. Add only a little salt when you think you need it. With time, your taste buds will adjust to less salt.    Eat fewer snack items, fast foods, canned soups, and other high-salt, high-fat, processed foods.    Read food labels and try to avoid saturated and trans fats. They increase your risk of heart disease by raising cholesterol levels.    Limit the amount of solid fat--butter, margarine, and shortening--you eat. Use olive, peanut, or canola oil when you cook. Bake, broil, and steam foods instead of frying them.    Eat a variety of fruit and vegetables every day. Dark green, deep orange, red, or yellow fruits and vegetables are especially good for you. Examples include spinach, carrots, peaches, and berries.    Foods high in fiber can reduce your cholesterol and provide important vitamins and minerals. High-fiber foods include whole-grain cereals and breads, oatmeal, beans, brown rice, citrus fruits, and apples.    Eat lean proteins. Heart-healthy proteins include seafood, lean meats and poultry, eggs, beans, peas, nuts, seeds, and soy products.    Limit drinks and foods with added sugar. These include candy, desserts, and soda pop.   Heart-healthy lifestyle   If your doctor recommends it, get more exercise. For many people, walking is a good choice. Or you may want to swim, bike, or do other activities. Bit by bit, increase the time you're active every day. Try for at least 30 minutes on most days of the week.    Try to quit or cut back on using tobacco and other nicotine products. This includes smoking and vaping. If you need help quitting, talk to your doctor about stop-smoking programs and medicines. These can increase  your chances of quitting for good. Quitting is one of the most important things you can do to protect your heart. It is never too late to quit. Try to avoid secondhand smoke too.    Stay at a weight that's healthy for you. Talk to your doctor if you need help losing weight.    Try to get 7 to 9 hours of sleep each night.    Limit alcohol to 2 drinks a day for men and 1 drink a day for women. Too much alcohol can cause health problems.    Manage other health problems such as diabetes, high blood pressure, and high cholesterol. If you think you may have a problem with alcohol or drug use, talk to your doctor.   Medicines   Take your medicines exactly as prescribed. Call your doctor if you think  you are having a problem with your medicine.    If your doctor recommends aspirin, take the amount directed each day. Make sure you take aspirin and not another kind of pain reliever, such as acetaminophen (Tylenol).   When should you call for help?   Call 911 if you have symptoms of a heart attack. These may include:   Chest pain or pressure, or a strange feeling in the chest.    Sweating.    Shortness of breath.    Pain, pressure, or a strange feeling in the back, neck, jaw, or upper belly or in one or both shoulders or arms.    Lightheadedness or sudden weakness.    A fast or irregular heartbeat.   After you call 911, the operator may tell you to chew 1 adult-strength or 2 to 4 low-dose aspirin. Wait for an ambulance. Do not try to drive yourself.  Watch closely for changes in your health, and be sure to contact your doctor if you have any problems.  Where can you learn more?  Go to [employer] and enter F075 to learn more about "A Healthy Heart: Care Instructions."  Current as of: March 25, 2022  Content Version: 14.1   2006-2024 [employer].   Care instructions adapted under license by [facility]. If you have questions about a medical condition or this instruction, always  ask your healthcare professional. Health[employer], Inc[employer] disclaims any warranty or liability for your use of this information.      Personalized Preventive Plan for Ashley Hogan - 04/26/2023  Medicare offers a range of preventive health benefits. Some of the tests and screenings are paid in full while other may be subject to a deductible, co-insurance, and/or copay.    Some of these benefits include a comprehensive review of your medical history including lifestyle, illnesses that may run in your family, and various assessments and screenings as appropriate.    After reviewing your medical record and screening and assessments performed today your provider may have ordered immunizations, labs, imaging, and/or referrals for you.  A list of these orders (if applicable) as well as your Preventive Care list are included within your After Visit Summary for your review.    Other Preventive Recommendations:    A preventive eye exam performed by an eye specialist is recommended every 1-2 years to screen for glaucoma; cataracts, macular degeneration, and other eye disorders.  A preventive dental visit is recommended every 6 months.  Try to get at least 150 minutes of exercise per week or 10,000 steps per day on a pedometer .  Order or download the FREE "Exercise & Physical Activity: Your Everyday Guide" from [employer]. Call 253-117-4932 or search [employer] online.  You need 1200-1500 mg of calcium and 1000-2000 IU of vitamin D per day. It is possible to meet your calcium requirement with diet alone, but a vitamin D supplement is usually necessary to meet this goal.  When exposed to the sun, use a sunscreen that protects against both UVA and UVB radiation with an SPF of 30 or greater. Reapply every 2 to 3 hours or after sweating, drying off with a towel, or swimming.  Always wear a seat belt when traveling in a car. Always wear a helmet when riding a bicycle or motorcycle.

## 2023-04-26 NOTE — Progress Notes (Signed)
Patient ID: Ashley Hogan is a 72 y.o. female.  Chief Complaint   Patient presents with    Medicare AWV     Pt aware of cpe policy      Here for awv and medically indicated f/u...  Pt here for yearly checkup/review.  Reviewed healthy habits, advice, immunizations, labs, and studies that may be pertinent for preventive care in this patient.  Handout on healthy habits offered and reviewed as well.   Reviewed as noted prior..  Mri looking for memory issues looked ok other than the sinus.  Abx helped with the sinus portions of things.  Keeps some allergies, but has been harder for her at times of year-using astepro with some help  Still feels like she had trouble remembering things still.  Not as good as prior.  Tinnitus she feels like is louder for her. Uses occ xanax to help with rest/sleep-normally does enough to help her rest.   Tinnitus bad even during day and not easily drowned out by other things at this point. Has seen ENT, but couldn't help much really. Has hearing aides, but doesn't really help this that much overall she says.   Some off balance feelings at times.   she enjoys swimming/walking/etc.  Htn-Pt doing well on current medications for bp and no sig side efx noted at this time.  Denies cp/sob/palps/new headache patterns/worsening edema   Prior on zetia for cholesterol but intolerant to statins in past she says-hasn't been able even to continue on zetia-says she understands risks, but prefers at this age to just not be on something. Discussed supplements like fish oil and red yeast rice.   protonix for reflux, but just prn,  no n/v and no blood noted.  Considering doing her estrogen ring again-she had appt but will      Memory Loss        Past Medical History:   Diagnosis Date    Anxiety     Carotid stenosis     carotid stenosis less than 50% on carotid dopplers done 10/2020    Cold sore     Encounter for screening colonoscopy 04/2021    GI-G[facility]din-planned colonoscopy 04/28/2021-diverticula/internal  hemorrhoids, o/w unremarkable. recheck 5 years per Gourdin    H/O magnetic resonance imaging of brain and brain stem 12/01/2022    Whole brain imaging shows no acute abnormality. 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 Fluid level in the right maxillary sinus, nonspecific but could reflect acute  sinusitis in the appropriate clinical setting.    Hearing aid worn     Hearing loss     hearing aides for tinnitus/hearing loss    High cholesterol     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    osteopenia at hip with t-1.9, normal at spine    Screening mammogram for breast cancer 02/20/2023    normal/negative    Tinnitus       Past Surgical History:   Procedure Laterality Date    APPENDECTOMY      BREAST ENHANCEMENT SURGERY Bilateral 10/01/2019    BREAST ENHANCEMENT SURGERY Bilateral 1985    BREAST IMPLANT REMOVAL Bilateral 10/02/2019    CESAREAN SECTION      FEMUR FRACTURE SURGERY        Family History   Problem Relation Age of Onset    Stroke Mother     Alcohol Abuse Father     Other Brother         non lymph hodgkins  Breast Cancer Maternal Cousin       No Known Allergies   Social History     Tobacco Use    Smoking status: Never    Smokeless tobacco: Never   Vaping Use    Vaping Use: Never used   Substance Use Topics    Alcohol use: Yes     Comment: 2-4 Times a month    Drug use: Never      Review of Systems   Constitutional:  Negative for chills and fever.   HENT:  Positive for tinnitus.    Respiratory:  Negative for cough and shortness of breath.    Cardiovascular:  Negative for chest pain, palpitations and leg swelling.   Gastrointestinal:  Negative for abdominal pain, nausea and vomiting.   Psychiatric/Behavioral:  Positive for decreased concentration and sleep disturbance.    Patient Care Team:  Mayme Genta, MD as PCP - General  Mayme Genta, MD as PCP - Empaneled Provider  Gourdin, Jae Dire, MD as Surgeon (Gastroenterology)  Doristine Johns, Oneal Deputy, MD as Consulting Physician  (Otolaryngology)  Haze Boyden, MD (Neurology)  Crantford, Brynda Peon, MD as Surgeon (Plastic Surgery)  Ashwander, Vilinda Blanks, MD as Consulting Physician (Vascular Surgery)  Belia Heman, MD as Consulting Physician (Obstetrics & Gynecology)   Current Outpatient Medications   Medication Sig Dispense Refill    loratadine (ALLERGY RELIEF) 10 MG tablet Take 1 tablet by mouth daily      ALPRAZolam (XANAX) 0.25 MG tablet Take 1 tablet by mouth nightly as needed for Sleep for up to 120 days. Max Daily Amount: 0.25 mg 30 tablet 3    lisinopril-hydroCHLOROthiazide (PRINZIDE;ZESTORETIC) 10-12.5 MG per tablet Take 1 tablet by mouth daily 30 tablet 3    FLUoxetine (PROZAC) 10 MG capsule Take 1 capsule by mouth daily 30 capsule 3    pantoprazole (PROTONIX) 40 MG tablet 1 tablet Orally Once a day for 30 day(s) (Patient not taking: Reported on 04/26/2023) 30 tablet 4    Estradiol (ESTRING) 7.5 MCG/24HR RING Place 1 each vaginally once for 1 dose Change every 3 months (Patient not taking: Reported on 11/16/2022) 1 each 4    lisinopril (PRINIVIL;ZESTRIL) 5 MG tablet 1 tablet Orally Once a day for 30 day(s) (Patient not taking: Reported on 04/26/2023) 90 tablet 3    azelastine HCl 0.15 % SOLN 2 sprays in each nostril Nasally Once a day for 30 day(s) (Patient not taking: Reported on 04/26/2023) 11 mL 3     No current facility-administered medications for this visit.      PHQ-9 Total Score: 0 (04/25/2023  5:00 PM)     Objective:     Lab Results   Component Value Date/Time    TSH 1.700 11/16/2022 12:45 PM       BP      Temp      Pulse     Resp      SpO2       There is no height or weight on file to calculate BMI.  Lab Results   Component Value Date    WBC 7.2 11/16/2022    HGB 13.5 11/16/2022    HCT 40.1 11/16/2022    MCV 94.4 11/16/2022    PLT 251 11/16/2022     Lab Results   Component Value Date    NA 140 11/16/2022    K 4.3 11/16/2022    CL 101 11/16/2022    CO2 27 11/16/2022    BUN 18 11/16/2022  CREATININE 0.8 11/16/2022     GLUCOSE 84 11/16/2022    CALCIUM 9.7 11/16/2022    BILITOT 0.42 11/16/2022    ALKPHOS 62 11/16/2022    AST 25 11/16/2022    ALT 17 11/16/2022    LABGLOM 78 11/16/2022    GLOB 2.4 11/16/2022     Hemoglobin A1C   Date Value Ref Range Status   04/19/2022 5.2 4.0 - 6.0 % Final     Comment:     HEMOGLOBIN A1C INTERPRETATION:    The following arbitrary ranges may be used for interpretation of the results.  However, factors such as duration of diabetes, adherence to therapy, and  patient age should also be considered in assessing degree of blood glucose  control.    Hemoglobin A1C                 Avg. Blood Sugar  --------------------------------------------------------------  6%                           135 mg/dL  7%                           170 mg/dL  8%                           205 mg/dL  9%                           240 mg/dL  16%                          275 mg/dL    ======================================================    A1C                      Glucose Control  ----------------------------------------------------------------  < 6.0 %                   Normal  6.0 - 6.9 %               Abnormal  7.0 - 7.9 %               Sub-Optimal Control  > 8.0 %                   Inadequate Control       Lab Results   Component Value Date    CHOL 277 (H) 04/19/2022    CHOL 277 (H) 04/18/2021     Lab Results   Component Value Date    TRIG 171 (H) 04/19/2022    TRIG 177 (H) 04/18/2021     Lab Results   Component Value Date    HDL 64 04/19/2022    HDL 63 04/18/2021     No components found for: "LDLCHOLESTEROL", "LDLCALC"  Lab Results   Component Value Date    VLDL 34.2 04/19/2022    VLDL 35.4 04/18/2021     Lab Results   Component Value Date    CHOLHDLRATIO 4.3 04/19/2022    CHOLHDLRATIO 4.4 04/18/2021     Lab Results   Component Value Date    TSH 1.700 11/16/2022     No results found for: "PSA"    Physical Exam  Constitutional:       General: She is not in acute distress.  Appearance: Normal appearance. She is not  ill-appearing or toxic-appearing.   HENT:      Head: Normocephalic and atraumatic.      Right Ear: External ear normal.      Left Ear: External ear normal.   Eyes:      Extraocular Movements: Extraocular movements intact.      Conjunctiva/sclera: Conjunctivae normal.   Neck:      Vascular: No carotid bruit.   Cardiovascular:      Rate and Rhythm: Normal rate and regular rhythm.      Pulses: Normal pulses.      Heart sounds: Normal heart sounds. No murmur heard.  Pulmonary:      Effort: Pulmonary effort is normal.      Breath sounds: Normal breath sounds. No wheezing.   Abdominal:      General: Bowel sounds are normal. There is no distension.      Palpations: Abdomen is soft.      Tenderness: There is no abdominal tenderness.   Neurological:      General: No focal deficit present.      Mental Status: She is alert and oriented to person, place, and time. Mental status is at baseline.   Psychiatric:         Mood and Affect: Mood normal.         Behavior: Behavior normal.   No results found for this visit on 04/26/23.   Assessment:      Encounter Diagnoses   Name Primary?    Encounter for annual wellness visit (AWV) in Medicare patient Yes    Primary hypertension     Memory loss     Pure hypercholesterolemia     Statin intolerance     Anxiety     Stress     Insomnia, unspecified type     Chronic pain of right knee     Tinnitus of both ears     Heartburn     Screening for diabetes mellitus     Need for Tdap vaccination     Need for pneumococcal vaccination     Osteopenia after menopause           Plan:   Assessment & Plan   AWV-reviewed with patient on all the elements of the AWV screening and preventive care.  Plan of care if needed made with patient today.  Discussed appropriate diet/exercise recommendations for their lifestyle/status today.  Reviewed pertinent age appropriate screening/preventive issues including home/fall risks, hearing needs, and smoking/etoh review as well as indicated immunizations and  studies/labs pertinent based on their age/risk factors.  Answered any questions to stated satisfaction.  Pt prefers to check in her records on prevnar/tdap, will consider if she finds they are due  Memory-cog screen here ok.  Doing ok overall but will try the estradiol ring and see if that helps some possibly.  Keep moving/active and using things that have good fatty acids/etc. Seeing Dr. Kelli Hope at [facility] now and per them, going for neuropsych testing.  Chol- statin intolerant and hard with zetia as well.  Will continue to watch diet/exercise.  Tinnitus-seen per musc recently but still there and not super helpful.  Going through some neuro testing  Heartburn/gerd-reviewed, continue medications as listed.  Will refill if needed by patient.  Reflux precautions reviewed: 1. Avoid trigger foods 2. Eat slowly and chew well with each bite. 3. No laying down or exercises immediately after eating for at least an hour-stay upright in seated position for an  hour if able. 4. Elevate head of bed with bricks or wedge pillow so that the head of bed is elevated 4-6 inches to help at bedtime keep reflux from the esophagus.  Osteopenia-try the estrogen mild supplementation though topical may help some, discussed 1/2 strength dose of fosamax but declines for now. Try citracal or caltrate and get weighted vest for walking and exercise.  Recheck in 2 years.  Insomnia-Proper sleep hygiene changes take a few weeks to get significantly better-please be patient.  Set routine sleep/wake times and follow them daily as closely as possible.  No tv or reading in bed at all if having trouble sleeping. Can read/watch elsewhere.  Bedroom should only be for sleep or sex o/w.  Ok to use sound machines or apps for white noise or pink noise.  Dark/cool and generally quiet rooms work best for sleep.  No exercise within 2-3 hours of bedtime. No caffeine after lunch time. Stop work at least 2-3 hours prior to bed as well and no screens for an hour prior  to bedtime.  If able limit eating near bedtime as well.  If in bed but can't get to sleep after 20 minutes, get up and go do something quiet and not activating in another room and come back/try again as getting sleepy.  Uses prn alprazolam, but not often.  Some delta 8 she uses with good effect.  Discussed use and trying to limit but emphasize the habits.   Knee still bothers, but she has seen ortho-[facility].  Rtc in 1 year for awv, earlier prn  Orders Placed This Encounter    CBC with Auto Differential     Standing Status:   Future     Standing Expiration Date:   10/26/2024    Comprehensive Metabolic Panel     Standing Status:   Future     Standing Expiration Date:   10/26/2024    Lipid Panel     Standing Status:   Future     Standing Expiration Date:   10/26/2024    Hemoglobin A1C     Standing Status:   Future     Standing Expiration Date:   10/26/2024    Urinalysis W/ Rflx Microscopic     Standing Status:   Future     Standing Expiration Date:   10/26/2024    loratadine (ALLERGY RELIEF) 10 MG tablet     Sig: Take 1 tablet by mouth daily            Mayme Genta, MD  Medicare Annual Wellness Visit    Ashley Hogan is here for Medicare AWV (Pt aware of cpe policy)    Assessment & Plan   Encounter for annual wellness visit (AWV) in Medicare patient  Primary hypertension  -     CBC with Auto Differential; Future  -     Comprehensive Metabolic Panel; Future  -     Lipid Panel; Future  -     Hemoglobin A1C; Future  -     Urinalysis W/ Rflx Microscopic; Future  Memory loss  -     CBC with Auto Differential; Future  -     Comprehensive Metabolic Panel; Future  Pure hypercholesterolemia  -     CBC with Auto Differential; Future  -     Comprehensive Metabolic Panel; Future  -     Lipid Panel; Future  Statin intolerance  Anxiety  Stress  Insomnia, unspecified type  Chronic pain of right knee  Tinnitus of both ears  Heartburn  Screening for diabetes mellitus  -     Hemoglobin A1C; Future  Need for Tdap vaccination  Need for  pneumococcal vaccination  Osteopenia after menopause  Medicare annual wellness visit, subsequent    Recommendations for Preventive Services Due: see orders and patient instructions/AVS.  Recommended screening schedule for the next 5-10 years is provided to the patient in written form: see Patient Instructions/AVS.     No follow-ups on file.     Subjective     See above  Patient's complete Health Risk Assessment and screening values have been reviewed and are found in Flowsheets. The following problems were reviewed today and where indicated follow up appointments were made and/or referrals ordered.    Positive Risk Factor Screenings with Interventions:                        Advanced Directives:  Do you have a Living Will?: (!) No    Intervention:  has NO advanced directive - information provided                     Objective   Vitals:    04/26/23 1143   BP: 130/73   Pulse: 56   Resp: 16   SpO2: 99%   Weight: 81 kg (178 lb 8 oz)   Height: 1.702 m (5\' 7" )      Body mass index is 27.96 kg/m.                  No Known Allergies  Prior to Visit Medications    Medication Sig Taking? Authorizing Provider   loratadine (ALLERGY RELIEF) 10 MG tablet Take 1 tablet by mouth daily Yes [provider]   ALPRAZolam (XANAX) 0.25 MG tablet Take 1 tablet by mouth nightly as needed for Sleep for up to 120 days. Max Daily Amount: 0.25 mg Yes Mayme Genta, MD   lisinopril-hydroCHLOROthiazide (PRINZIDE;ZESTORETIC) 10-12.5 MG per tablet Take 1 tablet by mouth daily Yes Mayme Genta, MD   FLUoxetine (PROZAC) 10 MG capsule Take 1 capsule by mouth daily Yes Mayme Genta, MD   Estradiol (ESTRING) 7.5 MCG/24HR RING Place 1 each vaginally once for 1 dose Change every 3 months  Patient not taking: Reported on 11/16/2022  Belia Heman, MD       CareTeam (Including outside providers/suppliers regularly involved in providing care):   Patient Care Team:  Mayme Genta, MD as PCP - General  Mayme Genta, MD as PCP -  Empaneled Provider  Gourdin, Jae Dire, MD as Surgeon (Gastroenterology)  Doristine Johns, Oneal Deputy, MD as Consulting Physician (Otolaryngology)  Haze Boyden, MD (Neurology)  Crantford, Brynda Peon, MD as Surgeon (Plastic Surgery)  Ashwander, Vilinda Blanks, MD as Consulting Physician (Vascular Surgery)  Belia Heman, MD as Consulting Physician (Obstetrics & Gynecology)      Reviewed and updated this visit:  Tobacco  Allergies  Meds  Problems  Med Hx  Surg Hx  Soc Hx  Fam Hx

## 2023-04-26 NOTE — Progress Notes (Signed)
Venipuncture: verbal consent obtained, 22 g needle, number of attempts: 1, patient tolerated procedure well , site cleansed with aseptic technique, pressure applied to site, tubes collected: 2 RA , Marny Lowenstein [facility] 04/26/2023

## 2023-04-27 LAB — CBC WITH AUTO DIFFERENTIAL
Basophils %: 1.3 % (ref 0.0–2.0)
Basophils Absolute: 0.1 10*3/uL (ref 0.0–0.2)
Eosinophils %: 4.4 % (ref 0.0–7.0)
Eosinophils Absolute: 0.3 10*3/uL (ref 0.0–0.5)
Hematocrit: 41.3 % (ref 34.0–47.0)
Hemoglobin: 13.7 g/dL (ref 11.5–15.7)
Immature Grans (Abs): 0.01 10*3/uL (ref 0.00–0.06)
Immature Granulocytes %: 0.2 % (ref 0.0–0.6)
Lymphocytes Absolute: 2.1 10*3/uL (ref 1.0–3.2)
Lymphocytes: 33.6 % (ref 15.0–45.0)
MCH: 31.9 pg (ref 27.0–34.5)
MCHC: 33.2 g/dL (ref 30.0–36.0)
MCV: 96 fL (ref 81.0–99.0)
MPV: 11.3 fL (ref 7.0–12.2)
Monocytes %: 8.7 % (ref 4.0–12.0)
Monocytes Absolute: 0.6 10*3/uL (ref 0.3–1.0)
NRBC Absolute: 0 10*3/uL (ref 0.000–0.012)
NRBC Automated: 0 % (ref 0.0–0.2)
Neutrophils %: 51.8 % (ref 42.0–74.0)
Neutrophils Absolute: 3.3 10*3/uL (ref 1.6–7.3)
Platelets: 243 10*3/uL (ref 140–440)
RBC: 4.3 x10e6/mcL (ref 3.60–5.20)
RDW: 13.5 % (ref 10.0–17.0)
WBC: 6.3 10*3/uL (ref 3.8–10.6)

## 2023-04-27 LAB — LIPID PANEL
Chol/HDL Ratio: 4.3 (ref 0.0–4.4)
Cholesterol, Total: 299 mg/dL — ABNORMAL HIGH (ref 100–200)
HDL: 70 mg/dL (ref >=50)
LDL Cholesterol: 197.2 mg/dL — ABNORMAL HIGH (ref 0.0–100.0)
LDL/HDL Ratio: 2.8
Triglycerides: 159 mg/dL — ABNORMAL HIGH (ref 0–149)
VLDL: 31.8 mg/dL (ref 5.0–40.0)

## 2023-04-27 LAB — COMPREHENSIVE METABOLIC PANEL
ALT: 16 U/L (ref 0–35)
AST: 23 U/L (ref 0–35)
Albumin/Globulin Ratio: 1.9 (ref 1.00–2.70)
Albumin: 4.5 g/dL (ref 3.5–5.2)
Alk Phosphatase: 57 U/L (ref 35–117)
Anion Gap: 11 mmol/L (ref 2–17)
BUN: 17 mg/dL (ref 8–23)
CO2: 26 mmol/L (ref 22–29)
Calcium: 9.6 mg/dL (ref 8.5–10.7)
Chloride: 100 mmol/L (ref 98–107)
Creatinine: 0.9 mg/dL (ref 0.5–1.0)
Est, Glom Filt Rate: 68 mL/min/1.73m (ref >=60)
Globulin: 2.4 g/dL (ref 1.9–4.4)
Glucose: 91 mg/dL (ref 70–99)
Osmolaliy Calculated: 275 mOsm/kg (ref 270–287)
Potassium: 4.2 mmol/L (ref 3.5–5.3)
Sodium: 137 mmol/L (ref 135–145)
Total Bilirubin: 0.39 mg/dL (ref 0.00–1.20)
Total Protein: 6.9 g/dL (ref 5.7–8.3)

## 2023-04-27 LAB — URINALYSIS W/ RFLX MICROSCOPIC
Bilirubin, Urine: NEGATIVE
Blood, Urine: NEGATIVE
Glucose, Ur: NEGATIVE mg/dL
Ketones, Urine: NEGATIVE mg/dL
Leukocyte Esterase, Urine: NEGATIVE
Nitrite, Urine: NEGATIVE
Protein, UA: NEGATIVE
Specific Gravity, UA: 1.015 (ref 1.003–1.035)
Urobilinogen, Urine: 0.2 EU/dL (ref >1.0)
pH, Urine: 6.5 (ref 4.5–8.0)

## 2023-04-27 LAB — HEMOGLOBIN A1C
Estimated Avg Glucose: 108
Estimated Avg Glucose: 115
Hemoglobin A1C: 5.4 % (ref 4.0–6.0)

## 2023-04-30 MED ORDER — BEMPEDOIC ACID 180 MG PO TABS
180 | ORAL_TABLET | Freq: Every day | ORAL | 3 refills | Status: AC
Start: 2023-04-30 — End: ?

## 2023-06-18 NOTE — Telephone Encounter (Signed)
 Could try amlodipine  5mg  one daily for bp and just check it as able to see how that helps and runs otherwise.  Thanks for the update, sorry the cholesterol med was pricey, but worth checking to see at least.  Hope you continue to feel well otherwise. Cand send amlodipine  5mg  one daily for bp, #30, 5 refills can try if bp not good enough on its own. thx

## 2023-06-19 MED ORDER — AMLODIPINE BESYLATE 5 MG PO TABS
5 | ORAL_TABLET | Freq: Every day | ORAL | 5 refills | Status: DC
Start: 2023-06-19 — End: 2023-11-29

## 2023-06-19 NOTE — Telephone Encounter (Signed)
 Medication sent in for patient now, please let them know

## 2023-06-19 NOTE — Telephone Encounter (Signed)
Adv pt of the above. Rx loaded please es if ok

## 2023-07-25 ENCOUNTER — Telehealth

## 2023-07-25 NOTE — Telephone Encounter (Signed)
Pt called in would like for someone to call her to discuss her medications that she is currently taking wants to see if a specific medication was orders "valasiclofor"

## 2023-07-26 MED ORDER — VALACYCLOVIR HCL 1 G PO TABS
1 | ORAL_TABLET | Freq: Two times a day (BID) | ORAL | 3 refills | Status: AC
Start: 2023-07-26 — End: ?

## 2023-07-26 NOTE — Telephone Encounter (Signed)
Pt called for a refill on valtrex. Unable to remember her prev dosage. States she is having a outbreak at this time

## 2023-07-26 NOTE — Telephone Encounter (Signed)
Pt states it is on her private area, closer to the right side of her buttocks

## 2023-09-19 ENCOUNTER — Ambulatory Visit
Admit: 2023-09-19 | Discharge: 2023-09-19 | Payer: MEDICARE | Attending: Family Medicine | Admitting: Family Medicine | Primary: Family Medicine

## 2023-09-19 VITALS — BP 139/86 | HR 57 | Resp 16 | Ht 67.0 in | Wt 182.5 lb

## 2023-09-19 DIAGNOSIS — R413 Other amnesia: Principal | ICD-10-CM

## 2023-09-19 MED ORDER — ROSUVASTATIN CALCIUM 5 MG PO TABS
5 | ORAL_TABLET | ORAL | 2 refills | Status: AC
Start: 2023-09-19 — End: ?

## 2023-09-19 MED ORDER — LISINOPRIL 10 MG PO TABS
10 | ORAL_TABLET | Freq: Two times a day (BID) | ORAL | 3 refills | Status: AC
Start: 2023-09-19 — End: ?

## 2023-09-19 MED ORDER — CHLORTHALIDONE 25 MG PO TABS
25 | ORAL_TABLET | Freq: Every day | ORAL | 3 refills | Status: AC
Start: 2023-09-19 — End: ?

## 2023-09-19 NOTE — Progress Notes (Signed)
 Patient ID: Ashley Hogan is a 72 y.o. female.  Chief Complaint   Patient presents with    Cholesterol Problem      Pt with hx of htn, chol-with statin intolerance, heartburn, mild memory loss, osteopenia, anxiety and hsv namely here for...  She stopp

## 2023-11-29 ENCOUNTER — Ambulatory Visit: Admit: 2023-11-29 | Discharge: 2023-11-29 | Payer: MEDICARE | Primary: Family Medicine

## 2023-11-29 ENCOUNTER — Ambulatory Visit: Admit: 2023-11-29 | Discharge: 2023-11-29 | Payer: MEDICARE | Attending: Orthopaedic Surgery | Primary: Family Medicine

## 2023-11-29 DIAGNOSIS — M25561 Pain in right knee: Secondary | ICD-10-CM

## 2023-11-29 DIAGNOSIS — S82024A Nondisplaced longitudinal fracture of right patella, initial encounter for closed fracture: Secondary | ICD-10-CM

## 2023-11-29 NOTE — Progress Notes (Signed)
 CC:   Chief Complaint   Patient presents with    Knee Pain     Right knee pain       HPI: Patient is a very pleasant 73 year old who presents today referred by one of my patients who she happened to meet in a bar in [place].  She had a [place] on [place], injuring the right knee and subsequent evaluation with x-rays in [place] demonstrated a longitudinally oriented fracture of the right patella without displacement.  She was given a knee immobilizer and told to restrict her activities, but did not really use the immobilizer very much.  She used a cane for a while but has been able to get away from that.  She has been climbing stairs cautiously, many times a day in her elevated home.  Swelling and bruising have improved, and she really does not have much discomfort.    Current Outpatient Medications   Medication Sig Dispense Refill    lisinopril (PRINIVIL;ZESTRIL) 10 MG tablet Take 1 tablet by mouth in the morning and at bedtime 60 tablet 3    chlorthalidone (HYGROTON) 25 MG tablet Take 1 tablet by mouth daily For bp 30 tablet 3    rosuvastatin (CRESTOR) 5 MG tablet One po 2x/week on Wednesday and Sundays for cholesterol 30 tablet 2    valACYclovir (VALTREX) 1 g tablet Take 1 tablet by mouth 2 times daily 10 tablet 3    amLODIPine (NORVASC) 5 MG tablet Take 1 tablet by mouth daily (Patient not taking: Reported on 09/19/2023) 30 tablet 5    Bempedoic Acid 180 MG TABS Take 180 mg by mouth daily For cholesterol 30 tablet 3    loratadine (ALLERGY RELIEF) 10 MG tablet Take 1 tablet by mouth daily      FLUoxetine (PROZAC) 10 MG capsule Take 1 capsule by mouth daily 30 capsule 3    ALPRAZolam (XANAX) 0.25 MG tablet Take 1 tablet by mouth nightly as needed for Sleep for up to 120 days. Max Daily Amount: 0.25 mg (Patient not taking: Reported on 09/19/2023) 30 tablet 3     No current facility-administered medications for this visit.       No Known Allergies    Past Medical History:   Diagnosis Date     Anxiety     Carotid stenosis     carotid stenosis less than 50% on carotid dopplers done 10/2020    Cold sore     Encounter for screening colonoscopy 04/2021    GI-[facility]din-planned colonoscopy 04/28/2021-diverticula/internal hemorrhoids, o/w unremarkable. recheck 5 years per Gourdin    H/O magnetic resonance imaging of brain and brain stem 12/01/2022    Whole brain imaging shows no acute abnormality. 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 Fluid level in the right maxillary sinus, nonspecific but could reflect acute  sinusitis in the appropriate clinical setting.    Hearing aid worn     Hearing loss     hearing aides for tinnitus/hearing loss    High cholesterol     Memory loss 05/30/2023    neuropsych testing with Dr. Mardene Speak for etiology.  some mild impairment noted, but nothing overtly diagnosable.    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    osteopenia at hip with t-1.9, normal at spine    Screening mammogram for breast cancer 02/20/2023    normal/negative    Tinnitus        Past Surgical History:   Procedure Laterality Date  APPENDECTOMY      BREAST ENHANCEMENT SURGERY Bilateral 10/01/2019    BREAST ENHANCEMENT SURGERY Bilateral 1985    BREAST IMPLANT REMOVAL Bilateral 10/02/2019    CESAREAN SECTION      FEMUR FRACTURE SURGERY Right 2001    mission hospital in [place]       Review of Systems     There were no vitals taken for this visit.     Physical Exam  Constitutional:       Appearance: Normal appearance. She is normal weight.   HENT:      Head: Normocephalic and atraumatic.   Pulmonary:      Effort: Pulmonary effort is normal.   Skin:     General: Skin is warm and dry.   Neurological:      General: No focal deficit present.      Mental Status: She is alert and oriented to person, place, and time.   Psychiatric:         Mood and Affect: Mood normal.         Behavior: Behavior normal.         Thought Content: Thought content normal.         Judgment: Judgment normal.         Musculoskeletal  Patient demonstrates an excellent range of motion with full active extension and flexion around 120 degrees.  There is minimal tenderness over the anterior patella.  1 quadrant medial and lateral glide.  No instability.  No appreciable effusion.    XR KNEE RIGHT (MIN 4 VIEWS)  Right knee AP lateral and tunnel weightbearing views and sunrise view   demonstrate a slightly curved longitudinally oriented fracture of the   patella around the junction of the middle and lateral thirds, without   significant displacement.        Problem List Items Addressed This Visit    None  Visit Diagnoses       Closed nondisplaced longitudinal fracture of right patella, initial encounter    -  Primary    Right knee pain, unspecified chronicity        Relevant Orders    XR KNEE RIGHT (MIN 4 VIEWS) (Completed)        I reviewed the x-rays with the patient today and we discussed her excellent progress and expectations for healing.  This longitudinal fracture has held up very well over the last month, without displacing, and with steady improvement in function.  We will allow her to cautiously increase her activities as tolerated, with a follow-up in a month with repeat x-rays.    Return in about 4 weeks (around 12/27/2023).    Elias Else, MD

## 2023-12-05 ENCOUNTER — Telehealth: Admit: 2023-12-05 | Discharge: 2023-12-05 | Payer: MEDICARE | Attending: Family Medicine | Primary: Family Medicine

## 2023-12-05 DIAGNOSIS — I10 Essential (primary) hypertension: Secondary | ICD-10-CM

## 2023-12-05 MED ORDER — ROSUVASTATIN CALCIUM 5 MG PO TABS
5 | ORAL_TABLET | ORAL | 5 refills | Status: AC
Start: 2023-12-05 — End: ?

## 2023-12-05 MED ORDER — VALACYCLOVIR HCL 1 G PO TABS
1 | ORAL_TABLET | Freq: Two times a day (BID) | ORAL | 1 refills | Status: AC
Start: 2023-12-05 — End: ?

## 2023-12-05 MED ORDER — CHLORTHALIDONE 25 MG PO TABS
25 | ORAL_TABLET | Freq: Every day | ORAL | 5 refills | Status: DC
Start: 2023-12-05 — End: 2024-08-15

## 2023-12-05 NOTE — Progress Notes (Signed)
 Patient ID: [id number] DAMMAN is a 73 y.o. female.  Chief Complaint   Patient presents with    2 month f/u      Pt with hx of htn, chol-with statin intolerance, heartburn, mild memory loss, osteopenia, anxiety and hsv namely  Here for f/u  Using lisinopril at bedtime and chlorthalidone at bedtime and seems to tolerate better she notes.  No recent check of bp at home  Still taking rosuvastatin at bedtime as well.    Has days with her memory but overall doing ok.       Past Medical History:   Diagnosis Date    Anxiety     Carotid stenosis     carotid stenosis less than 50% on carotid dopplers done 10/2020    Cold sore     Encounter for screening colonoscopy 04/2021    GI-Gourdin-planned colonoscopy 04/28/2021-diverticula/internal hemorrhoids, o/w unremarkable. recheck 5 years per Gourdin    H/O magnetic resonance imaging of brain and brain stem 12/01/2022    Whole brain imaging shows no acute abnormality. 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 Fluid level in the right maxillary sinus, nonspecific but could reflect acute  sinusitis in the appropriate clinical setting.    Hearing aid worn     Hearing loss     hearing aides for tinnitus/hearing loss    High cholesterol     Memory loss 05/30/2023    neuropsych testing with Dr. Mardene Speak for etiology.  some mild impairment noted, but nothing overtly diagnosable.    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    osteopenia at hip with t-1.9, normal at spine    Patella fracture     Screening mammogram for breast cancer 02/20/2023    normal/negative    Tinnitus       Past Surgical History:   Procedure Laterality Date    APPENDECTOMY      BREAST ENHANCEMENT SURGERY Bilateral 10/01/2019    BREAST ENHANCEMENT SURGERY Bilateral 1985    BREAST IMPLANT REMOVAL Bilateral 10/02/2019    CESAREAN SECTION      FEMUR FRACTURE SURGERY Right 2001    [facility] hospital in [place]      Family History   Problem Relation Age of Onset    Stroke Mother     Alcohol Abuse  Father     Other Brother         non lymph hodgkins    Breast Cancer Maternal Cousin       No Known Allergies   Social History     Tobacco Use    Smoking status: Never    Smokeless tobacco: Never   Vaping Use    Vaping status: Never Used   Substance Use Topics    Alcohol use: Yes     Comment: 2-4 Times a month    Drug use: Never      Review of Systems   Constitutional:  Positive for fatigue. Negative for chills and fever.   Respiratory:  Negative for cough and shortness of breath.    Cardiovascular:  Negative for chest pain, palpitations and leg swelling.   Gastrointestinal:  Negative for abdominal pain, nausea and vomiting.   Neurological:  Negative for weakness.   Psychiatric/Behavioral:  Positive for decreased concentration and sleep disturbance.      Patient Care Team:  Mayme Genta, MD as PCP - General  Mayme Genta, MD as PCP - Empaneled Provider  Gourdin, Jae Dire, MD as Surgeon (Gastroenterology)  [facility], Oneal Deputy,  MD as Consulting Physician (Otolaryngology)  Haze Boyden, MD (Neurology)  [place], Brynda Peon, MD as Surgeon (Plastic Surgery)  [place], Vilinda Blanks, MD as Consulting Physician (Vascular Surgery)  Belia Heman, MD as Consulting Physician (Obstetrics & Gynecology)  Larwance Sachs, DO (Neurology)  Richardine Service, MD as Consulting Physician (Orthopedic Surgery)  Jerelyn Charles, MD (Neuropsychology)   Current Outpatient Medications   Medication Sig Dispense Refill    valACYclovir (VALTREX) 1 g tablet Take 1 tablet by mouth 2 times daily 60 tablet 1    chlorthalidone (HYGROTON) 25 MG tablet Take 1 tablet by mouth daily For bp 30 tablet 5    rosuvastatin (CRESTOR) 5 MG tablet One po 2x/week on Wednesday and Sundays for cholesterol 30 tablet 5    lisinopril (PRINIVIL;ZESTRIL) 10 MG tablet Take 1 tablet by mouth in the morning and at bedtime 60 tablet 3    FLUoxetine (PROZAC) 10 MG capsule Take 1 capsule by mouth daily 30 capsule 3     No current facility-administered  medications for this visit.      PHQ-9 Total Score: 0 (12/05/2023 10:29 AM)     Objective:     Lab Results   Component Value Date/Time    TSH 1.700 11/16/2022 12:45 PM       BP      Temp      Pulse     Resp      SpO2       There is no height or weight on file to calculate BMI.  Lab Results   Component Value Date    WBC 6.3 04/26/2023    HGB 13.7 04/26/2023    HCT 41.3 04/26/2023    MCV 96.0 04/26/2023    PLT 243 04/26/2023     Lab Results   Component Value Date    NA 137 04/26/2023    K 4.2 04/26/2023    CL 100 04/26/2023    CO2 26 04/26/2023    BUN 17 04/26/2023    CREATININE 0.9 04/26/2023    GLUCOSE 91 04/26/2023    CALCIUM 9.6 04/26/2023    BILITOT 0.39 04/26/2023    ALKPHOS 57 04/26/2023    AST 23 04/26/2023    ALT 16 04/26/2023    LABGLOM 68 04/26/2023    GLOB 2.4 04/26/2023     Hemoglobin A1C   Date Value Ref Range Status   04/26/2023 5.4 4.0 - 6.0 % Final     Comment:     HEMOGLOBIN A1C INTERPRETATION:    The following arbitrary ranges may be used for interpretation of the results.  However, factors such as duration of diabetes, adherence to therapy, and  patient age should also be considered in assessing degree of blood glucose  control.    Hemoglobin A1C                 Avg. Blood Sugar  --------------------------------------------------------------  6%                           135 mg/dL  7%                           170 mg/dL  8%                           20 5 mg/dL  9%  240 mg/dL  16%                          275 mg/dL    ======================================================    A1C                      Glucose Control  ----------------------------------------------------------------  < 6.0 %                   Normal  6.0 - 6.9 %               Abnormal  7.0 - 7.9 %               Sub-Optimal Control  > 8.0 %                   Inadequate Control       Lab Results   Component Value Date    CHOL 299 (H) 04/26/2023    CHOL 277 (H) 04/19/2022    CHOL 277 (H) 04/18/2021     Lab Results    Component Value Date    TRIG 159 (H) 04/26/2023    TRIG 171 (H) 04/19/2022    TRIG 177 (H) 04/18/2021     Lab Results   Component Value Date    HDL 70 04/26/2023    HDL 64 04/19/2022    HDL 63 04/18/2021     No components found for: "LDLCHOLESTEROL", "LDLCALC"  Lab Results   Component Value Date    VLDL 31.8 04/26/2023    VLDL 34.2 04/19/2022    VLDL 35.4 04/18/2021     Lab Results   Component Value Date    CHOLHDLRATIO 4.3 04/26/2023    CHOLHDLRATIO 4.3 04/19/2022    CHOLHDLRATIO 4.4 04/18/2021     Lab Results   Component Value Date    TSH 1.700 11/16/2022     No results found for: "PSA"    Physical Exam  Constitutional:       General: She is not in acute distress.     Appearance: Normal appearance. She is not ill-appearing or toxic-appearing.   HENT:      Head: Normocephalic and atraumatic.   Eyes:      Extraocular Movements: Extraocular movements intact.      Conjunctiva/sclera: Conjunctivae normal.   Pulmonary:      Effort: Pulmonary effort is normal.      Comments: Conversant without obvious distress  Neurological:      General: No focal deficit present.      Mental Status: She is alert. Mental status is at baseline.   Psychiatric:         Mood and Affect: Mood normal.         Behavior: Behavior normal.         Thought Content: Thought content normal.       No results found for this visit on 12/05/23.   Assessment:      Encounter Diagnoses   Name Primary?    Primary hypertension Yes    Pure hypercholesterolemia     Memory loss     Osteopenia after menopause     Anxiety     HSV (herpes simplex virus) infection           Plan:   Assessment & Plan   Htn-Pt doing well on current medications, tolerating w/o sig side effects. Refilled meds if needed at this time. Reviewed briefly Sharilyn Sites  diet and exercise/stress reduction techniques if applicable get labs soon to make sure doing better and tolerable, will check and let us know.  Cholesterol-Pt tolerating current medications w/o sig side effects.  Reviewed lipid panel  history.  Continue current medications-refill if needed.  Have advised mediterranean style diet as helpful for cholesterol and longevity generally. Labs soon.  Osteopenia-recent knee fx with fall, work on ca/vit d and wt bearing exercise and consider further in future, maybe even low dose fosamax to help  Hsv-refill valtrex, stable, continue prn  Mood-stable, continue fluoxetine as doing.  I confirm that I have managed the broad scope of the patient's health needs by furnishing care for some or all of the patient's acute and/or chronic conditions across a spectrum of diagnoses and organ systems that will require ongoing care with myself or someone on my team.  Rtc in July/August for awv please  Orders Placed This Encounter    Lipid Panel     Standing Status:   Future     Standing Expiration Date:   06/06/2025    Comprehensive Metabolic Panel     Standing Status:   Future     Standing Expiration Date:   06/06/2025    valACYclovir (VALTREX) 1 g tablet     Sig: Take 1 tablet by mouth 2 times daily     Dispense:  60 tablet     Refill:  1    chlorthalidone (HYGROTON) 25 MG tablet     Sig: Take 1 tablet by mouth daily For bp     Dispense:  30 tablet     Refill:  5    rosuvastatin (CRESTOR) 5 MG tablet     Sig: One po 2x/week on Wednesday and Sundays for cholesterol     Dispense:  30 tablet     Refill:  5            Mayme Genta, MD

## 2023-12-26 ENCOUNTER — Ambulatory Visit: Admit: 2023-12-26 | Discharge: 2023-12-26 | Payer: MEDICARE | Attending: Orthopaedic Surgery | Primary: Family Medicine

## 2023-12-26 ENCOUNTER — Ambulatory Visit: Admit: 2023-12-26 | Discharge: 2023-12-26 | Payer: MEDICARE | Primary: Family Medicine

## 2023-12-26 DIAGNOSIS — S82024D Nondisplaced longitudinal fracture of right patella, subsequent encounter for closed fracture with routine healing: Secondary | ICD-10-CM

## 2023-12-26 NOTE — Progress Notes (Signed)
 Ashley Hogan (DOB:  1951-06-14) is a 73 y.o. female, here for evaluation of the following chief complaint(s):  Follow-up right patella fracture date of injury October 28, 2023    SUBJECTIVE/OBJECTIVE:  HPI    Patient has been increasing her activity gradually and tolerating it well.  Still has some soreness at times but she is able to go up and down stairs although with some pain.  Uses ibuprofen as needed.  Chiropractor has been doing some treatment.        Physical Exam  Musculoskeletal  Right knee has no effusion.  Range of motion is essentially equal to the contralateral side.  Minimal tenderness over the lateral patella.  Mild crepitus with active range of motion.  Slight limited patellar mobility.       XR KNEE RIGHT (1-2 VIEWS) (CLINIC PERFORMED)  AP and lateral views of the right knee show the longitudinal lateral   fracture of the patella remains well aligned.  There was no sunrise view   today.           ASSESSMENT/PLAN:  1. Closed nondisplaced longitudinal fracture of right patella with routine healing, subsequent encounter  -     XR KNEE RIGHT (1-2 VIEWS) (CLINIC PERFORMED)    Patient is clinically and radiographically doing well.  She will continue to progress activities gradually as tolerated and we will plan reevaluation with repeat x-rays in late July, 6 months postinjury.  Will include sunrise view at that time.    Return in about 4 months (around 04/26/2024).        An electronic signature was used to authenticate this note.    --Elias Else, MD

## 2024-04-23 ENCOUNTER — Ambulatory Visit: Admit: 2024-04-23 | Discharge: 2024-04-23 | Payer: MEDICARE | Attending: Orthopaedic Surgery | Primary: Family Medicine

## 2024-04-23 ENCOUNTER — Ambulatory Visit: Admit: 2024-04-23 | Discharge: 2024-04-23 | Payer: MEDICARE | Primary: Family Medicine

## 2024-04-23 DIAGNOSIS — S82024D Nondisplaced longitudinal fracture of right patella, subsequent encounter for closed fracture with routine healing: Principal | ICD-10-CM

## 2024-04-23 NOTE — Progress Notes (Signed)
 Ashley Hogan (DOB:  24-Sep-1951) is a 73 y.o. female, here for evaluation of the following chief complaint(s): Follow-up on right patella fracture, date of injury October 28, 2023      SUBJECTIVE/OBJECTIVE:  HPI    Patient returns for follow-up on her right patella longitudinal lateral fracture, nondisplaced.  She has been doing quite well, increasing activities as tolerated, with some minimal discomfort at times.  Has not noticed any swelling.  No mechanical symptoms.    Physical Exam  Musculoskeletal  No swelling.  No tenderness about the patella.  Normal tracking.  Full range of motion.  No instability.  No effusion.     XR KNEE RIGHT (1-2 VIEWS)  AP and lateral weightbearing views and sunrise view of the right knee show   continued progression of healing of the longitudinal lateral facet   patellar fracture, nondisplaced.             ASSESSMENT/PLAN:  1. Closed nondisplaced longitudinal fracture of right patella with routine healing, subsequent encounter  -     XR KNEE RIGHT (1-2 VIEWS)    Patient is doing very well functionally and radiographically with her patella fracture and is released to unrestricted activities at this time.    Return if symptoms worsen or fail to improve.        An electronic signature was used to authenticate this note.    --Lamar CHRISTELLA Caraway, MD

## 2024-04-30 ENCOUNTER — Encounter: Attending: Family Medicine | Primary: Family Medicine

## 2024-05-15 ENCOUNTER — Ambulatory Visit: Admit: 2024-05-15 | Discharge: 2024-05-15 | Payer: MEDICARE | Attending: Family Medicine | Primary: Family Medicine

## 2024-05-15 DIAGNOSIS — Z0000 Encounter for general adult medical examination without abnormal findings: Principal | ICD-10-CM

## 2024-05-15 MED ORDER — FLUOXETINE HCL 10 MG PO CAPS
10 | ORAL_CAPSULE | Freq: Every day | ORAL | 3 refills | 90.00000 days | Status: DC
Start: 2024-05-15 — End: 2024-08-15

## 2024-05-15 MED ORDER — LISINOPRIL 10 MG PO TABS
10 | ORAL_TABLET | Freq: Two times a day (BID) | ORAL | 3 refills | 90.00000 days | Status: AC
Start: 2024-05-15 — End: ?

## 2024-05-15 MED ORDER — CITRACAL CALCIUM +D3 600-40-500 MG-MG-UNIT PO TB24
600-40-500 | ORAL_TABLET | Freq: Two times a day (BID) | ORAL | 3 refills | Status: AC
Start: 2024-05-15 — End: ?

## 2024-05-15 MED ORDER — ROSUVASTATIN CALCIUM 5 MG PO TABS
5 | ORAL_TABLET | ORAL | 5 refills | 90.00000 days | Status: AC
Start: 2024-05-15 — End: ?

## 2024-05-15 NOTE — Progress Notes (Signed)
 Patient ID: Ashley Hogan is a 73 y.o. female.  Chief Complaint   Patient presents with    Medicare AWV      Here for awv and f/u on issues...  Pt with hx of htn, chol-with statin intolerance, heartburn, mild memory loss, osteopenia, anxiety and hsv namely  Here for f/u  Using lisinopril  at bedtime and chlorthalidone  at bedtime and seems to tolerate better she notes.  No recent check of bp at home  Still taking rosuvastatin  at bedtime as well as able-not daily.   Has days with her memory but overall doing ok.   On fluoxetine  for anxiety and doing well overall  Followed by ortho-Dr. Deronda but released to normal activity recently.  Going back for memory testing again with Dr. Alfonza soon she says.  Hasn't been great lately she says but still not poor enough it seems to bother.      Past Medical History:   Diagnosis Date    Anxiety     Carotid stenosis     carotid stenosis less than 50% on carotid dopplers done 10/2020    Cold sore     Encounter for screening colonoscopy 04/2021    GI-Gourdin-planned colonoscopy 04/28/2021-diverticula/internal hemorrhoids, o/w unremarkable. recheck 5 years per Gourdin    H/O magnetic resonance imaging of brain and brain stem 12/01/2022    Whole brain imaging shows no acute abnormality. 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 Fluid level in the right maxillary sinus, nonspecific but could reflect acute  sinusitis in the appropriate clinical setting.    Hearing aid worn     Hearing loss     hearing aides for tinnitus/hearing loss    High cholesterol     Hypertension     Memory loss 05/30/2023    neuropsych testing with Dr. Benigno for etiology.  some mild impairment noted, but nothing overtly diagnosable.    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    osteopenia at hip with t-1.9, normal at spine    Patella fracture     Screening mammogram for breast cancer 02/20/2023    normal/negative    Tinnitus       Past Surgical History:   Procedure Laterality Date     APPENDECTOMY      BREAST ENHANCEMENT SURGERY Bilateral 10/01/2019    BREAST ENHANCEMENT SURGERY Bilateral 1985    BREAST IMPLANT REMOVAL Bilateral 10/02/2019    CESAREAN SECTION      FEMUR FRACTURE SURGERY Right 2001    [facility] in [place]      Family History   Problem Relation Age of Onset    Stroke Mother     Alcohol Abuse Father     Other Brother         non lymph hodgkins    Breast Cancer Maternal Cousin       No Known Allergies   Social History     Tobacco Use    Smoking status: Never    Smokeless tobacco: Never   Vaping Use    Vaping status: Never Used   Substance Use Topics    Alcohol use: Yes     Alcohol/week: 2.0 standard drinks of alcohol     Types: 2 Glasses of wine per week     Comment: 2-4 Times a month    Drug use: Never      Review of Systems   Constitutional:  Positive for fatigue. Negative for chills and fever.   Respiratory:  Negative for cough and  shortness of breath.    Cardiovascular:  Negative for chest pain, palpitations and leg swelling.   Gastrointestinal:  Negative for abdominal pain, nausea and vomiting.   Neurological:  Negative for weakness.   Psychiatric/Behavioral:  Positive for decreased concentration and sleep disturbance.      Patient Care Team:  Krystal Dozier NOVAK, MD as PCP - General  Krystal Dozier NOVAK, MD as PCP - Empaneled Provider  Gourdin, Ricardo Mango, MD as Surgeon (Gastroenterology)  Abbe, Dallas CROME, MD as Consulting Physician (Otolaryngology)  Vinita Debby Slocumb, MD (Neurology)  Crantford, Norleen Ina, MD as Surgeon (Plastic Surgery)  Ashwander, Elsie RAMAN, MD as Consulting Physician (Vascular Surgery)  Idella Bouchard, MD as Consulting Physician (Obstetrics & Gynecology)  Alfonza Lint, DO (Neurology)  Dolores Miquel SHAUNNA MADISON, MD as Consulting Physician (Orthopedic Surgery)  Marelyn Mini, MD (Neuropsychology)   Current Outpatient Medications   Medication Sig Dispense Refill    lisinopril  (PRINIVIL ;ZESTRIL ) 10 MG tablet Take 1 tablet by mouth in the morning  and at bedtime 180 tablet 3    FLUoxetine  (PROZAC ) 10 MG capsule Take 1 capsule by mouth daily 90 capsule 3    rosuvastatin  (CRESTOR ) 5 MG tablet One po 2x/week on Wednesday and Sundays for cholesterol 30 tablet 5    calcium -mag-vit D ER (CITRACAL CALCIUM  +D3) 600-40-500 MG-MG-UNIT TB24 Take 1 tablet by mouth 2 times daily For bone density/osteopenia 180 tablet 3    valACYclovir  (VALTREX ) 1 g tablet Take 1 tablet by mouth 2 times daily 60 tablet 1    chlorthalidone  (HYGROTON ) 25 MG tablet Take 1 tablet by mouth daily For bp 30 tablet 5     No current facility-administered medications for this visit.      PHQ-9 Total Score: 1 (05/15/2024  9:40 AM)     Objective:     Lab Results   Component Value Date/Time    TSH 1.700 11/16/2022 12:45 PM       BP 117/73 (05/15/24 0937)    Temp      Pulse 56 (05/15/24 0937)   Resp      SpO2 100 % (05/15/24 0937)     Body mass index is 27.8 kg/m.  Lab Results   Component Value Date    WBC 6.3 04/26/2023    HGB 13.7 04/26/2023    HCT 41.3 04/26/2023    MCV 96.0 04/26/2023    PLT 243 04/26/2023     Lab Results   Component Value Date    NA 137 04/26/2023    K 4.2 04/26/2023    CL 100 04/26/2023    CO2 26 04/26/2023    BUN 17 04/26/2023    CREATININE 0.9 04/26/2023    GLUCOSE 91 04/26/2023    CALCIUM  9.6 04/26/2023    BILITOT 0.39 04/26/2023    ALKPHOS 57 04/26/2023    AST 23 04/26/2023    ALT 16 04/26/2023    LABGLOM 68 04/26/2023    GLOB 2.4 04/26/2023     Hemoglobin A1C   Date Value Ref Range Status   04/26/2023 5.4 4.0 - 6.0 % Final     Comment:     HEMOGLOBIN A1C INTERPRETATION:    The following arbitrary ranges may be used for interpretation of the results.  However, factors such as duration of diabetes, adherence to therapy, and  patient age should also be considered in assessing degree of blood glucose  control.    Hemoglobin A1C  Avg. Blood Sugar  --------------------------------------------------------------  6%                           135 mg/dL  7%                            170 mg/dL  8%                           205 mg/dL  9%                           240 mg/dL  89%                          275 mg/dL    ======================================================    A1C                      Glucose Control  ----------------------------------------------------------------  < 6.0 %                   Normal  6.0 - 6.9 %               Abnormal  7.0 - 7.9 %               Sub-Optimal Control  > 8.0 %                   Inadequate Control       Lab Results   Component Value Date    CHOL 299 (H) 04/26/2023    CHOL 277 (H) 04/19/2022    CHOL 277 (H) 04/18/2021     Lab Results   Component Value Date    TRIG 159 (H) 04/26/2023    TRIG 171 (H) 04/19/2022    TRIG 177 (H) 04/18/2021     Lab Results   Component Value Date    HDL 70 04/26/2023    HDL 64 04/19/2022    HDL 63 04/18/2021     No components found for: LDLCHOLESTEROL, LDLCALC  Lab Results   Component Value Date    VLDL 31.8 04/26/2023    VLDL 34.2 04/19/2022    VLDL 35.4 04/18/2021     Lab Results   Component Value Date    CHOLHDLRATIO 4.3 04/26/2023    CHOLHDLRATIO 4.3 04/19/2022    CHOLHDLRATIO 4.4 04/18/2021     Lab Results   Component Value Date    TSH 1.700 11/16/2022     No results found for: PSA    Physical Exam  Constitutional:       General: She is not in acute distress.     Appearance: Normal appearance. She is not ill-appearing or toxic-appearing.   HENT:      Head: Normocephalic and atraumatic.      Right Ear: External ear normal.      Left Ear: External ear normal.   Eyes:      Extraocular Movements: Extraocular movements intact.      Conjunctiva/sclera: Conjunctivae normal.   Neck:      Vascular: No carotid bruit.   Cardiovascular:      Rate and Rhythm: Normal rate and regular rhythm.      Pulses: Normal pulses.      Heart sounds: Normal heart sounds. No murmur heard.  Pulmonary:  Effort: Pulmonary effort is normal. No respiratory distress.      Breath sounds: Normal breath sounds. No wheezing.   Abdominal:       General: Bowel sounds are normal. There is no distension.      Palpations: Abdomen is soft.      Tenderness: There is no abdominal tenderness.   Neurological:      General: No focal deficit present.      Mental Status: She is alert and oriented to person, place, and time. Mental status is at baseline.   Psychiatric:         Mood and Affect: Mood normal.         Behavior: Behavior normal.       No results found for this visit on 05/15/24.   Assessment:      Encounter Diagnoses   Name Primary?    Encounter for annual wellness visit (AWV) in Medicare patient Yes    Primary hypertension     Pure hypercholesterolemia     Memory loss     Osteopenia after menopause     Anxiety     HSV (herpes simplex virus) infection     Screening for diabetes mellitus     Screening mammogram for breast cancer     Stress     Bilateral hearing loss, unspecified hearing loss type     Tinnitus of both ears           Plan:   Assessment & Plan   AWV-reviewed with patient on all the elements of the AWV screening and preventive care.  Plan of care if needed made with patient today.  Discussed appropriate diet/exercise recommendations for their lifestyle/status today.  Reviewed pertinent age appropriate screening/preventive issues including home/fall risks, hearing needs, and smoking/etoh review as well as indicated immunizations and studies/labs pertinent based on their age/risk factors.  Answered any questions to stated satisfaction.  Htn-Pt doing well on current medications, tolerating w/o sig side effects. Refilled meds if needed at this time. Reviewed briefly Dash diet and exercise/stress reduction techniques if applicable get labs soon to make sure doing better and tolerable, will check and let us  know.  Cholesterol-Pt tolerating current medications w/o sig side effects.  Reviewed lipid panel history.  Continue current medications-refill if needed.  Have advised mediterranean style diet as helpful for cholesterol and longevity generally.  Labs soon.  Osteopenia-recent knee fx with fall, work on ca/vit d and wt bearing exercise and consider further in future, maybe even low dose fosamax to help  Hsv-refill valtrex , stable, continue prn  Mood-stable, continue fluoxetine  as doing.  Imm-stable, says had most updated prior to leaving country last year.   Refer hearing eval as associated with memory changes as well  May end up with [facility] or other for memory help if not changing.  Has f/u upcoming with neurology will check back after memory testing and see what came of that and consider possibly even trying a strattera or the like as well.   I confirm that I have managed the broad scope of the patient's health needs by furnishing care for some or all of the patient's acute and/or chronic conditions across a spectrum of diagnoses and organ systems that will require ongoing care with myself or someone on my team.  Rtc 3 months recheck on memory/review-after her neuro appt hopefully, and then 1 year for awv again.  Orders Placed This Encounter    MAM TOMO DIGITAL SCREEN BILATERAL (PER PROTOCOL)  Further imaging can be completed per [facility] protocol.     Standing Status:   Future     Expected Date:   05/15/2024     Expiration Date:   07/15/2025    CBC with Auto Differential     Standing Status:   Future     Expected Date:   05/15/2024     Expiration Date:   11/15/2025    Comprehensive Metabolic Panel     Standing Status:   Future     Expected Date:   05/15/2024     Expiration Date:   11/15/2025    Lipid Panel     Standing Status:   Future     Expected Date:   05/15/2024     Expiration Date:   11/15/2025    Hemoglobin A1C     Standing Status:   Future     Expected Date:   05/15/2024     Expiration Date:   11/15/2025    Urinalysis W/ Rflx Microscopic     Standing Status:   Future     Expected Date:   05/15/2024     Expiration Date:   11/15/2025    [facility] ENT - Audiology     Referral Priority:   Routine     Referral Type:   Eval and Treat     Referral  Reason:   Specialty Services Required     Referral Location:   [facility] ENT, Audiology     Requested Specialty:   Audiology     Number of Visits Requested:   1    lisinopril  (PRINIVIL ;ZESTRIL ) 10 MG tablet     Sig: Take 1 tablet by mouth in the morning and at bedtime     Dispense:  180 tablet     Refill:  3    FLUoxetine  (PROZAC ) 10 MG capsule     Sig: Take 1 capsule by mouth daily     Dispense:  90 capsule     Refill:  3    rosuvastatin  (CRESTOR ) 5 MG tablet     Sig: One po 2x/week on Wednesday and Sundays for cholesterol     Dispense:  30 tablet     Refill:  5    calcium -mag-vit D ER (CITRACAL CALCIUM  +D3) 600-40-500 MG-MG-UNIT TB24     Sig: Take 1 tablet by mouth 2 times daily For bone density/osteopenia     Dispense:  180 tablet     Refill:  3            Dozier KATHEE Boas, MD  Medicare Annual Wellness Visit    Ashley Hogan is here for Medicare AWV    Assessment & Plan   Encounter for annual wellness visit (AWV) in Medicare patient  Primary hypertension  -     CBC with Auto Differential; Future  -     Comprehensive Metabolic Panel; Future  -     Lipid Panel; Future  -     Hemoglobin A1C; Future  -     Urinalysis W/ Rflx Microscopic; Future  -     lisinopril  (PRINIVIL ;ZESTRIL ) 10 MG tablet; Take 1 tablet by mouth in the morning and at bedtime, Disp-180 tablet, R-3Normal  Pure hypercholesterolemia  -     CBC with Auto Differential; Future  -     Comprehensive Metabolic Panel; Future  -     Lipid Panel; Future  -     rosuvastatin  (CRESTOR ) 5 MG tablet; One po 2x/week on Wednesday  and Sundays for cholesterol, Disp-30 tablet, R-5Normal  Memory loss  Osteopenia after menopause  -     calcium -mag-vit D ER (CITRACAL CALCIUM  +D3) 600-40-500 MG-MG-UNIT TB24; Take 1 tablet by mouth 2 times daily For bone density/osteopenia, Disp-180 tablet, R-3Normal  Anxiety  -     FLUoxetine  (PROZAC ) 10 MG capsule; Take 1 capsule by mouth daily, Disp-90 capsule, R-3Normal  HSV (herpes simplex virus) infection  Screening for diabetes  mellitus  -     Hemoglobin A1C; Future  Screening mammogram for breast cancer  -     MAM TOMO DIGITAL SCREEN BILATERAL (PER PROTOCOL); Future  Stress  -     FLUoxetine  (PROZAC ) 10 MG capsule; Take 1 capsule by mouth daily, Disp-90 capsule, R-3Normal  Bilateral hearing loss, unspecified hearing loss type  [facility] ENT - Audiology  Tinnitus of both ears  [facility] - Audiology       Return for 3 months recheck memory, 1 year for awv.     Subjective     See above  Patient's complete Health Risk Assessment and screening values have been reviewed and are found in Flowsheets. The following problems were reviewed today and where indicated follow up appointments were made and/or referrals ordered.    Positive Risk Factor Screenings with Interventions:    Fall Risk:  Do you feel unsteady or are you worried about falling? : no  2 or more falls in past year?: no  Fall with injury in past year?: (!) yesInterventions:    Reviewed medications, home hazards, visual acuity, and co-morbidities that can increase risk for falls  See AVS for additional education material                 Hearing Screen:  Do you or your family notice any trouble with your hearing that hasn't been managed with hearing aids?: (!) Yes  Interventions:  Has tinnitus but not checked recently-discussed/offered.   See AVS for additional education material    Vision Screen:  Do you have difficulty driving, watching TV, or doing any of your daily activities because of your eyesight?: (!) Yes  Have you had an eye exam within the past year?: Appointment is scheduled  Interventions:   Patient encouraged to make appointment with their eye specialist  See AVS for additional education material    Safety:  Do you have non-slip mats or non-slip surfaces or shower bars or grab bars in your shower or bathtub?: (!) No  Interventions:  See AVS for additional education material                    Objective   Vitals:    05/15/24 0937   BP: 117/73   BP Site:  Right Upper Arm   Patient Position: Sitting   BP Cuff Size: Medium Adult   Pulse: 56   SpO2: 100%   Weight: 80.5 kg (177 lb 8 oz)      Body mass index is 27.8 kg/m.                No Known Allergies  Prior to Visit Medications   Medication Sig Taking? Authorizing Provider   lisinopril  (PRINIVIL ;ZESTRIL ) 10 MG tablet Take 1 tablet by mouth in the morning and at bedtime Yes Krystal Dozier NOVAK, MD   FLUoxetine  (PROZAC ) 10 MG capsule Take 1 capsule by mouth daily Yes Krystal Dozier NOVAK, MD   rosuvastatin  (CRESTOR ) 5 MG tablet  One po 2x/week on Wednesday and Sundays for cholesterol Yes Krystal Dozier NOVAK, MD   calcium -mag-vit D ER (CITRACAL CALCIUM  +D3) 600-40-500 MG-MG-UNIT TB24 Take 1 tablet by mouth 2 times daily For bone density/osteopenia Yes Krystal Dozier NOVAK, MD   valACYclovir  (VALTREX ) 1 g tablet Take 1 tablet by mouth 2 times daily  Krystal Dozier NOVAK, MD   chlorthalidone  (HYGROTON ) 25 MG tablet Take 1 tablet by mouth daily For bp  Krystal Dozier NOVAK, MD       CareTeam (Including outside providers/suppliers regularly involved in providing care):   Patient Care Team:  Krystal Dozier NOVAK, MD as PCP - General  Krystal Dozier NOVAK, MD as PCP - Empaneled Provider  Gourdin, Ricardo Mango, MD as Surgeon (Gastroenterology)  Abbe, Dallas CROME, MD as Consulting Physician (Otolaryngology)  Vinita Debby Slocumb, MD (Neurology)  Crantford, Norleen Ina, MD as Surgeon (Plastic Surgery)  Ashwander, Elsie RAMAN, MD as Consulting Physician (Vascular Surgery)  Idella Bouchard, MD as Consulting Physician (Obstetrics & Gynecology)  Alfonza Lint, DO (Neurology)  Dolores Miquel SHAUNNA MADISON, MD as Consulting Physician (Orthopedic Surgery)  Marelyn Mini, MD (Neuropsychology)     Recommendations for Preventive Services Due: see orders and patient instructions/AVS.  Recommended screening schedule for the next 5-10 years is provided to the patient in written form: see Patient Instructions/AVS.     Reviewed and updated this visit:  Tobacco  Allergies  Meds   Problems  Med Hx  Surg Hx  Fam Hx  Sexual   Hx

## 2024-05-15 NOTE — Patient Instructions (Signed)
 Preventing Falls: Care Instructions  Injuries and health problems such as trouble walking or poor eyesight can increase your risk of falling. So can some medicines. But there are things you can do to help prevent falls. You can exercise to get stronger. You can also arrange your home to make it safer.    Talk to your doctor about the medicines you take. Ask if any of them increase the risk of falls and whether they can be changed or stopped.   Try to exercise regularly. It can help improve your strength and balance. This can help lower your risk of falling.         Practice fall safety and prevention.   Wear low-heeled shoes that fit well and give your feet good support. Talk to your doctor if you have foot problems that make this hard.  Carry a cellphone or wear a medical alert device that you can use to call for help.  Use stepladders instead of chairs to reach high objects. Don't climb if you're at risk for falls. Ask for help, if needed.  Wear the correct eyeglasses, if you need them.        Make your home safer.   Remove rugs, cords, clutter, and furniture from walkways.  Keep your house well lit. Use night-lights in hallways and bathrooms.  Install and use sturdy handrails on stairways.  Wear nonskid footwear, even inside. Don't walk barefoot or in socks without shoes.        Be safe outside.   Use handrails, curb cuts, and ramps whenever possible.  Keep your hands free by using a shoulder bag or backpack.  Try to walk in well-lit areas. Watch out for uneven ground, changes in pavement, and debris.  Be careful in the winter. Walk on the grass or gravel when sidewalks are slippery. Use de-icer on steps and walkways. Add non-slip devices to shoes.    Put grab bars and nonskid mats in your shower or tub and near the toilet. Try to use a shower chair or bath bench when bathing.   Get into a tub or shower by putting in your weaker leg first. Get out with your strong side first. Have a phone or medical alert  device in the bathroom with you.   Where can you learn more?  Go to [employer] and enter G117 to learn more about Preventing Falls: Care Instructions.  Current as of: May 02, 2023  Content Version: 14.5   [address] .   Care instructions adapted under license by [facility]. If you have questions about a medical condition or this instruction, always ask your healthcare professional. Romayne Alderman, [facility], disclaims any warranty or liability for your use of this information.         Hearing Loss: Care Instructions  Overview     Hearing loss is a sudden or slow decrease in how well you hear. It can range from slight to profound. Permanent hearing loss can occur with aging. It also can happen when you are exposed long-term to loud noise. Examples include listening to loud music, riding motorcycles, or being around other loud machines.  Hearing loss can affect your work and home life. It can make you feel lonely or depressed. You may feel that you have lost your independence. But hearing aids and other devices can help you hear better and feel connected to others.  Follow-up care is a key part of your treatment and safety. Be sure to make and  go to all appointments, and call your doctor if you are having problems. It's also a good idea to know your test results and keep a list of the medicines you take.  How can you care for yourself at home?  Avoid loud noises whenever possible. This helps keep your hearing from getting worse.  Always wear hearing protection around loud noises.  Wear a hearing aid as directed.  A professional can help you pick a hearing aid that will work best for you.  You can also get hearing aids over the counter for mild to moderate hearing loss.  Have hearing tests as your doctor suggests. They can show whether your hearing has changed. Your hearing aid may need to be adjusted.  Use other devices as needed. These may include:  Telephone  amplifiers and hearing aids that can connect to a television, stereo, radio, or microphone.  Devices that use lights or vibrations. These alert you to the doorbell, a ringing telephone, or a baby monitor.  Television closed-captioning. This shows the words at the bottom of the screen. Most new TVs can do this.  TTY (text telephone). This lets you type messages back and forth on the telephone instead of talking or listening. These devices are also called TDD. When messages are typed on the keyboard, they are sent over the phone line to a receiving TTY. The message is shown on a monitor.  Use text messaging, social media, and email if it is hard for you to communicate by telephone.  Try to learn a listening technique called speechreading. It is not lipreading. You pay attention to people's gestures, expressions, posture, and tone of voice. These clues can help you understand what a person is saying. Face the person you are talking to, and have them face you. Make sure the lighting is good. You need to see the other person's face clearly.  Think about counseling if you need help to adjust to your hearing loss.  When should you call for help?  Watch closely for changes in your health, and be sure to contact your doctor if:    You think your hearing is getting worse.     You have new symptoms, such as dizziness or nausea.   Where can you learn more?  Go to [employer] and enter R798 to learn more about Hearing Loss: Care Instructions.  Current as of: July 29, 2023  Content Version: 14.5   [address], [place].   Care instructions adapted under license by [facility]. If you have questions about a medical condition or this instruction, always ask your healthcare professional. Romayne Alderman, [facility], disclaims any warranty or liability for your use of this information.         Learning About Vision Tests  What are vision tests?     The four most common vision tests are visual  acuity tests, refraction, visual field tests, and color vision tests.  Visual acuity (sharpness) tests  These tests are used:  To see if you need glasses or contact lenses.  To monitor an eye problem.  To check an eye injury.  Visual acuity tests are done as part of routine exams. You may also have this test when you get your driver's license or apply for some types of jobs.  Visual field tests  These tests are used:  To check for vision loss in any area of your range of vision.  To screen for certain eye diseases.  To look for nerve damage  after a stroke, head injury, or other problem that could reduce blood flow to the brain.  Refraction and color tests  A refraction test is done to find the right prescription for glasses and contact lenses.  A color vision test is done to check for color blindness.  Color vision is often tested as part of a routine exam. You may also have this test when you apply for a job where recognizing different colors is important, such as truck driving, Optician, dispensing, or the [employer].  How are vision tests done?  Visual acuity test   You cover one eye at a time.  You read aloud from a wall chart across the room.  You read aloud from a small card that you hold in your hand.  Refraction   You look into a special device.  The device puts lenses of different strengths in front of each eye to see how strong your glasses or contact lenses need to be.  Visual field tests   Your doctor may have you look through special machines.  Or your doctor may simply have you stare straight ahead while they move a finger into and out of your field of vision.  Color vision test   You look at pieces of printed test patterns in various colors. You say what number or symbol you see.  Your doctor may have you trace the number or symbol using a pointer.  How do these tests feel?  There is very little chance of having a problem from this test. If dilating drops are used for a vision test, they may make the eyes sting  and cause a medicine taste in the mouth.  Follow-up care is a key part of your treatment and safety. Be sure to make and go to all appointments, and call your doctor if you are having problems. It's also a good idea to know your test results and keep a list of the medicines you take.  Where can you learn more?  Go to [employer] and enter G551 to learn more about Learning About Vision Tests.  Current as of: May 02, 2023  Content Version: 14.5   [address], [place].   Care instructions adapted under license by [facility]. If you have questions about a medical condition or this instruction, always ask your healthcare professional. Romayne Alderman, [facility], disclaims any warranty or liability for your use of this information.         A Healthy Heart: Care Instructions  Overview     Coronary artery disease, also called heart disease, occurs when a substance called plaque builds up in the vessels that supply oxygen-rich blood to your heart muscle. This can narrow the blood vessels and reduce blood flow. A heart attack happens when blood flow is completely blocked. A high-fat diet, smoking, and other factors increase the risk of heart disease.  Your doctor has found that you have a chance of having heart disease. A heart-healthy lifestyle can help keep your heart healthy and prevent heart disease. This lifestyle includes eating healthy, being active, staying at a weight that's healthy for you, and not smoking or using tobacco. It also includes taking medicines as directed, managing other health conditions, and trying to get a healthy amount of sleep.  Follow-up care is a key part of your treatment and safety. Be sure to make and go to all appointments, and call your doctor if you are having problems. It's also a good idea to know your test results  and keep a list of the medicines you take.  How can you care for yourself at home?  Diet    Use less salt when you cook and eat. This  helps lower your blood pressure. Taste food before salting. Add only a little salt when you think you need it. With time, your taste buds will adjust to less salt.     Eat fewer snack items, fast foods, canned soups, and other high-salt, high-fat, processed foods.     Read food labels and try to avoid saturated and trans fats. They increase your risk of heart disease by raising cholesterol levels.     Limit the amount of solid fat--butter, margarine, and shortening--you eat. Use olive, peanut, or canola oil when you cook. Bake, broil, and steam foods instead of frying them.     Eat a variety of fruit and vegetables every day. Dark green, deep orange, red, or yellow fruits and vegetables are especially good for you. Examples include spinach, carrots, peaches, and berries.     Foods high in fiber can reduce your cholesterol and provide important vitamins and minerals. High-fiber foods include whole-grain cereals and breads, oatmeal, beans, brown rice, citrus fruits, and apples.     Eat lean proteins. Heart-healthy proteins include seafood, lean meats and poultry, eggs, beans, peas, nuts, seeds, and soy products.     Limit drinks and foods with added sugar. These include candy, desserts, and soda pop.   Heart-healthy lifestyle    If your doctor recommends it, get more exercise. For many people, walking is a good choice. Or you may want to swim, bike, or do other activities. Bit by bit, increase the time you're active every day. Try for at least 30 minutes on most days of the week.     Try to quit or cut back on using tobacco and other nicotine products. This includes smoking and vaping. If you need help quitting, talk to your doctor about stop-smoking programs and medicines. These can increase your chances of quitting for good. Quitting is one of the most important things you can do to protect your heart. It is never too late to quit. Try to avoid secondhand smoke too.     Stay at a weight that's healthy for you. Talk  to your doctor if you need help losing weight.     Try to get 7 to 9 hours of sleep each night.     Limit alcohol to 2 drinks a day for men and 1 drink a day for women. Too much alcohol can cause health problems.     Manage other health problems such as diabetes, high blood pressure, and high cholesterol. If you think you may have a problem with alcohol or drug use, talk to your doctor.   Medicines    Take your medicines exactly as prescribed. Call your doctor if you think you are having a problem with your medicine.     If your doctor recommends aspirin, take the amount directed each day. Make sure you take aspirin and not another kind of pain reliever, such as acetaminophen (Tylenol).   When should you call for help?   Call 911 if you have symptoms of a heart attack. These may include:    Chest pain or pressure, or a strange feeling in the chest.     Sweating.     Shortness of breath.     Pain, pressure, or a strange feeling in the back, neck, jaw, or upper belly  or in one or both shoulders or arms.     Lightheadedness or sudden weakness.     A fast or irregular heartbeat.   After you call 911, the operator may tell you to chew 1 adult-strength or 2 to 4 low-dose aspirin. Wait for an ambulance. Do not try to drive yourself.  Watch closely for changes in your health, and be sure to contact your doctor if you have any problems.  Where can you learn more?  Go to [employer] and enter F075 to learn more about A Healthy Heart: Care Instructions.  Current as of: May 02, 2023  Content Version: 14.5   [address] [place].   Care instructions adapted under license by [employer]. If you have questions about a medical condition or this instruction, always ask your healthcare professional. Romayne Alderman, [facility], disclaims any warranty or liability for your use of this information.    Personalized Preventive Plan for Ashley Hogan - 05/15/2024  Medicare offers a range of preventive  health benefits. Some of the tests and screenings are paid in full while other may be subject to a deductible, co-insurance, and/or copay.  Some of these benefits include a comprehensive review of your medical history including lifestyle, illnesses that may run in your family, and various assessments and screenings as appropriate.  After reviewing your medical record and screening and assessments performed today your provider may have ordered immunizations, labs, imaging, and/or referrals for you.  A list of these orders (if applicable) as well as your Preventive Care list are included within your After Visit Summary for your review.

## 2024-05-16 LAB — LIPID PANEL
Chol/HDL Ratio: 4.8 — ABNORMAL HIGH (ref 0.0–4.4)
Cholesterol, Total: 283 mg/dL — ABNORMAL HIGH (ref 100–200)
HDL: 59 mg/dL (ref >=50)
LDL Cholesterol: 186.4 mg/dL — ABNORMAL HIGH (ref 0.0–100.0)
LDL/HDL Ratio: 3.2
Triglycerides: 188 mg/dL — ABNORMAL HIGH (ref 0–149)
VLDL: 37.6 mg/dL (ref 5.0–40.0)

## 2024-05-16 LAB — URINALYSIS W/ RFLX MICROSCOPIC
Bilirubin, Urine: NEGATIVE
Glucose, Ur: NEGATIVE
Ketones, Urine: NEGATIVE
Nitrite, Urine: NEGATIVE
Protein, UA: NEGATIVE
Specific Gravity, UA: 1.01 (ref 1.003–1.035)
Urobilinogen, Urine: 0.2 EU/dL (ref 0.2–1.0)
pH, Urine: 6 (ref 4.5–8.0)

## 2024-05-16 LAB — COMPREHENSIVE METABOLIC PANEL
ALT: 16 U/L (ref 0–42)
AST: 23 U/L (ref 0–46)
Albumin/Globulin Ratio: 1.9 (ref 1.00–2.70)
Albumin: 4.4 g/dL (ref 3.5–5.2)
Alk Phosphatase: 53 U/L (ref 35–117)
Anion Gap: 12 mmol/L (ref 2–17)
BUN: 27 mg/dL — ABNORMAL HIGH (ref 8–23)
CO2: 26 mmol/L (ref 22–29)
Calcium: 9.6 mg/dL (ref 8.5–10.7)
Chloride: 99 mmol/L (ref 98–107)
Creatinine: 1.2 mg/dL — ABNORMAL HIGH (ref 0.5–1.0)
Est, Glom Filt Rate: 48 mL/min/1.73m — ABNORMAL LOW (ref >=60)
Globulin: 2.3 g/dL (ref 1.9–4.4)
Glucose: 101 mg/dL — ABNORMAL HIGH (ref 70–99)
Osmolaliy Calculated: 279 mosm/kg (ref 270–287)
Potassium: 3.8 mmol/L (ref 3.5–5.3)
Sodium: 137 mmol/L (ref 135–145)
Total Bilirubin: 0.35 mg/dL (ref 0.00–1.20)
Total Protein: 6.7 g/dL (ref 5.7–8.3)

## 2024-05-16 LAB — CBC WITH AUTO DIFFERENTIAL
Basophils %: 1.1 % (ref 0.0–2.0)
Basophils Absolute: 0.1 x10e3/mcL (ref 0.0–0.2)
Eosinophils %: 3.6 % (ref 0.0–7.0)
Eosinophils Absolute: 0.2 x10e3/mcL (ref 0.0–0.5)
Hematocrit: 39.5 % (ref 34.0–47.0)
Hemoglobin: 12.8 g/dL (ref 11.5–15.7)
Immature Grans (Abs): 0.01 x10e3/mcL (ref 0.00–0.06)
Immature Granulocytes %: 0.2 % (ref 0.0–0.6)
Lymphocytes Absolute: 1.8 x10e3/mcL (ref 1.0–3.2)
Lymphocytes: 28.9 % (ref 15.0–45.0)
MCH: 31.1 pg (ref 27.0–34.5)
MCHC: 32.4 g/dL (ref 30.0–36.0)
MCV: 95.9 fL (ref 81.0–99.0)
MPV: 10.6 fL (ref 7.0–12.2)
Monocytes %: 7.2 % (ref 4.0–12.0)
Monocytes Absolute: 0.4 x10e3/mcL (ref 0.3–1.0)
NRBC Absolute: 0 x10e3/mcL (ref 0.000–0.012)
NRBC Automated: 0 % (ref 0.0–0.2)
Neutrophils %: 59 % (ref 42.0–74.0)
Neutrophils Absolute: 3.6 x10e3/mcL (ref 1.6–7.3)
Platelets: 275 x10e3/mcL (ref 140–440)
RBC: 4.12 x10e6/mcL (ref 3.60–5.20)
RDW: 13.1 % (ref 10.0–17.0)
WBC: 6.2 x10e3/mcL (ref 3.8–10.6)

## 2024-05-16 LAB — HEMOGLOBIN A1C
Estimated Avg Glucose: 114
Estimated Avg Glucose: 122
Hemoglobin A1C: 5.6 % (ref 4.0–6.0)

## 2024-05-16 LAB — MICROSCOPIC URINALYSIS
Amorphous, UA: NONE SEEN /HPF
MUCUS, URINE: NONE SEEN /LPF

## 2024-05-22 ENCOUNTER — Ambulatory Visit: Payer: MEDICARE | Primary: Family Medicine

## 2024-05-23 ENCOUNTER — Inpatient Hospital Stay: Admit: 2024-05-23 | Payer: MEDICARE | Attending: Family Medicine | Primary: Family Medicine

## 2024-05-23 DIAGNOSIS — Z1231 Encounter for screening mammogram for malignant neoplasm of breast: Principal | ICD-10-CM

## 2024-08-01 NOTE — Telephone Encounter (Signed)
"  Pt thinks that her uterus popped out last night. Pt said that she is wanting to see Dr. Idella. Pt will be out of town until next Tuesday and would like to see if she could come in for a np appt asap. Please triage.   "

## 2024-08-01 NOTE — Telephone Encounter (Signed)
"  Mess left on vm that no asap appt  are available.  "

## 2024-08-05 ENCOUNTER — Inpatient Hospital Stay
Admit: 2024-08-05 | Discharge: 2024-08-05 | Disposition: A | Discharge status: Home / Self Care | Payer: MEDICARE | Arrived: VH | Attending: Emergency Medicine

## 2024-08-05 ENCOUNTER — Encounter

## 2024-08-05 DIAGNOSIS — N814 Uterovaginal prolapse, unspecified: Secondary | ICD-10-CM

## 2024-08-05 LAB — POC URINALYSIS, CHEMISTRY
Bilirubin, Urine, POC: NEGATIVE
Glucose, UA POC: NEGATIVE mg/dL
Ketones, Urine, POC: NEGATIVE mg/dL
Nitrate, UA POC: NEGATIVE
Protein, Urine, POC: NEGATIVE
Specific Gravity, Urine, POC: 1.015 (ref 1.003–1.035)
Urine Urobilinogen: 0.2 EU/dL (ref >0.2)
pH, Urine, POC: 7 (ref 4.5–8.0)

## 2024-08-05 NOTE — ED Provider Notes (Signed)
 "[facility] EMERGENCY DEPT  EMERGENCY DEPARTMENT ENCOUNTER      Pt Name: Ashley Hogan  MRN: [id number]  Birthdate 26-Oct-1950  Date of evaluation: 08/05/2024  Provider: Lamar CHRISTELLA Sayer, MD    CHIEF COMPLAINT       Chief Complaint   Patient presents with    Uterine Prolapse     Pt states she was doing some house cleaning- started having pain, noticed uterus was coming out 3-4 days ago- unable to get in with a gynecologist     HISTORY OF PRESENT ILLNESS    Ashley Hogan is a 73 y.o. female who presents to the emergency department     Patient with history of hypertension, anxiety and carotid stenosis presents complaining of intermittent vaginal bulge over the past 4 to 5 days.  Patient says she first noticed it after helping clean up her mother-in-law's house in [place] .  She called her cousin who is a gynecologist in [place]  who told her it is likely uterine prolapse.  Patient says she has been in [place]  and daily reduces the prolapse and decided to come home to [place] to have this evaluated.  She denies abdominal pain.  No nausea or vomiting.  No fever or chills.  No dysuria or hematuria.    The history is provided by the patient.       Nursing Notes were reviewed.  REVIEW OF SYSTEMS     Review of Systems   Constitutional: Negative.    Respiratory: Negative.     Cardiovascular: Negative.    Gastrointestinal: Negative.    Genitourinary:  Negative for difficulty urinating, dysuria and pelvic pain.   Musculoskeletal: Negative.    Skin: Negative.    Neurological: Negative.        Except as noted above the remainder of the review of systems was reviewed and negative.   PAST MEDICAL HISTORY     Past Medical History:   Diagnosis Date    Anxiety     Carotid stenosis     carotid stenosis less than 50% on carotid dopplers done 10/2020    Cold sore     Encounter for screening colonoscopy 04/2021    [facility]-planned colonoscopy 04/28/2021-diverticula/internal hemorrhoids, o/w unremarkable. recheck 5 years per [facility]    H/O  magnetic resonance imaging of brain and brain stem 12/01/2022    Whole brain imaging shows no acute abnormality. 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 Fluid level in the right maxillary sinus, nonspecific but could reflect acute  sinusitis in the appropriate clinical setting.    Hearing aid worn     Hearing loss     hearing aides for tinnitus/hearing loss    High cholesterol     Hypertension     Memory loss 05/30/2023    neuropsych testing with Dr. Benigno for etiology.  some mild impairment noted, but nothing overtly diagnosable.    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    osteopenia at hip with t-1.9, normal at spine    Patella fracture     Screening mammogram for breast cancer 05/23/2024    benign    Tinnitus      SURGICAL HISTORY       Past Surgical History:   Procedure Laterality Date    APPENDECTOMY      BREAST ENHANCEMENT SURGERY Bilateral 09/30/2021    BREAST ENHANCEMENT SURGERY Bilateral 1985    BREAST IMPLANT REMOVAL Bilateral 10/02/2019    CESAREAN SECTION      FEMUR FRACTURE SURGERY  Right 2001    [facility] in [place]     CURRENT MEDICATIONS       Previous Medications    CALCIUM -MAG-VIT D ER (CITRACAL CALCIUM  +D3) 600-40-500 MG-MG-UNIT TB24    Take 1 tablet by mouth 2 times daily For bone density/osteopenia    CHLORTHALIDONE  (HYGROTON ) 25 MG TABLET    Take 1 tablet by mouth daily For bp    FLUOXETINE  (PROZAC ) 10 MG CAPSULE    Take 1 capsule by mouth daily    LISINOPRIL  (PRINIVIL ;ZESTRIL ) 10 MG TABLET    Take 1 tablet by mouth in the morning and at bedtime    ROSUVASTATIN  (CRESTOR ) 5 MG TABLET    One po 2x/week on Wednesday and Sundays for cholesterol    VALACYCLOVIR (VALTREX) 1 G TABLET    Take 1 tablet by mouth 2 times daily     ALLERGIES     Patient has no known allergies.  FAMILY HISTORY       Family History   Problem Relation Age of Onset    Stroke Mother     Alcohol Abuse Father     Other Brother         non lymph hodgkins    Breast Cancer Maternal Cousin        SOCIAL HISTORY       Social History     Socioeconomic History    Marital status: Divorced   Tobacco Use    Smoking status: Never    Smokeless tobacco: Never   Vaping Use    Vaping status: Never Used   Substance and Sexual Activity    Alcohol use: Yes     Alcohol/week: 2.0 standard drinks of alcohol     Types: 2 Glasses of wine per week     Comment: 2-4 Times a month    Drug use: Never    Sexual activity: Yes     Partners: Male     Social Drivers of Health     Physical Activity: Sufficiently Active (05/07/2024)    Exercise Vital Sign     Days of Exercise per Week: 7 days     Minutes of Exercise per Session: 90 min     SCREENINGS     Glasgow Coma Scale  Eye Opening: Spontaneous  Best Verbal Response: Oriented  Best Motor Response: Obeys commands  Glasgow Coma Scale Score: 15         CIWA Assessment  BP: (!) 198/85  Pulse: 62           PHYSICAL EXAM       ED Triage Vitals [08/05/24 0929]   BP Girls Systolic BP Percentile Girls Diastolic BP Percentile Boys Systolic BP Percentile Boys Diastolic BP Percentile Temp Temp Source Pulse   (!) 217/98 -- -- -- -- 97.7 F (36.5 C) Oral 62      Respirations SpO2 Height Weight - Scale       15  99 % 1.702 m (5' 7) 81 kg (178 lb 9.2 oz)           Physical Exam  Vitals and nursing note reviewed. Exam conducted with a chaperone present.   Constitutional:       General: She is not in acute distress.     Appearance: Normal appearance. She is not ill-appearing, toxic-appearing or diaphoretic.   HENT:      Head: Normocephalic and atraumatic.   Pulmonary:      Effort: Pulmonary effort is normal. No respiratory distress.   Abdominal:  Palpations: Abdomen is soft.      Tenderness: There is no abdominal tenderness. There is no guarding or rebound.   Genitourinary:     General: Normal vulva.      Comments: Bimanual exam reveals a small area of prolapse right upper uterus.  Externally there is no evidence of prolapse.  Musculoskeletal:         General: Normal range of motion.   Skin:      General: Skin is warm and dry.   Neurological:      General: No focal deficit present.      Mental Status: She is alert.   Psychiatric:         Mood and Affect: Mood normal.         Behavior: Behavior normal.         DIAGNOSTIC RESULTS   EKG: All EKG's are interpreted by the Emergency Department Physician who either signs or Co-signs this chart in the absence of a cardiologist.    RADIOLOGY:   Non-plain film images such as CT, Ultrasound and MRI are read by the radiologist. Plain radiographic images are visualized and preliminarily interpreted by the emergency physician with the below findings:    Interpretation per the Radiologist below, if available at the time of this note:    No orders to display       LABS:  Labs Reviewed   POC URINALYSIS, CHEMISTRY - Abnormal; Notable for the following components:       Result Value    Leukocytes, UA Trace (*)     All other components within normal limits       All other labs were within normal range or not returned as of this dictation.  EMERGENCY DEPARTMENT COURSE and DIFFERENTIAL DIAGNOSIS/MDM:   Vitals:    Vitals:    08/05/24 0929 08/05/24 1131   BP: (!) 217/98 (!) 198/85   Pulse: 62    Resp: 15    Temp: 97.7 F (36.5 C)    TempSrc: Oral    SpO2: 99%    Weight: 81 kg (178 lb 9.2 oz)    Height: 1.702 m (5' 7)          Medical Decision Making  Patient presents with symptoms of uterine prolapse intermittently over the past 4 to 5 days.  She denies any pain and is able to reduce it manually herself.  She says it occurs every afternoon and occurs with any Valsalva maneuver.  No abdominal pain.  No dysuria or hematuria.  Chaperoned exam reveals small area of prolapse in the right upper uterus on bimanual exam.  There is no evidence of prolapse externally.  Abdominal exam is benign.  I spoke with Dr. Sebastian of [facility] who asked the patient to contact Dr. Waddell who is a urogynecologist for follow-up.  Alternatively, Dr. Sebastian said that she can follow-up in her  clinic but she does not perform this procedures.  Return precautions given.    Amount and/or Complexity of Data Reviewed  Labs: ordered.     Details: Urine dip negative  Discussion of management or test interpretation with external provider(s): 12:20 - Dr. Sebastian from [facility] asks pt to contact her office to follow-up with Dr. Waddell.        REASSESSMENT          CRITICAL CARE TIME       CONSULTS:  None    PROCEDURES:  Unless otherwise noted below, none     Procedures  FINAL IMPRESSION      1. Uterine prolapse          DISPOSITION/PLAN   DISPOSITION Decision To Discharge 08/05/2024 12:18:13 PM    PATIENT REFERRED TO:  Waddell Elna NOVAK, MD  [address] [place] [address]  [phone]    Schedule an appointment as soon as possible for a visit       Sebastian Tinnie BRAVO, MD  [address]  [place] [address]  [phone]      As needed    [facility] EMERGENCY DEPT  [address]  [place]  [address]  [phone]    If symptoms worsen      DISCHARGE MEDICATIONS:  New Prescriptions    No medications on file       (Please note that portions of this note were completed with a voice recognition program.  Efforts were made to edit the dictations but occasionally words are mis-transcribed.)  Lamar CHRISTELLA Sayer, MD (electronically signed)  Attending Emergency Physician           Sayer Lamar Bold, MD  08/05/24 1232    "

## 2024-08-05 NOTE — Telephone Encounter (Signed)
"  Needs a referral to Elna KATHEE Birmingham [phone] because of ER visit. And Tinnie Hummer [phone].  Uterine prolapse  "

## 2024-08-05 NOTE — Telephone Encounter (Signed)
"  Pt said that she was seen at the [facility] and is needing to schedule an appt w/ Dr. Waddell. Pt said that she is needing to be seen w/ Dr. Waddell. Informed pt that a referral is needed. Pt said that the hospital has been trying to get through to the office for an hour. Please assist. Please cb pt.   "

## 2024-08-05 NOTE — Telephone Encounter (Signed)
"  Alex w/ [facility] Emergency Dept called to let us  know that they do not really do referrals but that the patient needs to be seen for a uterine prolapse.   "

## 2024-08-05 NOTE — Telephone Encounter (Signed)
"  Spoke to Anetra, Dr. Iven CMA and pt has to have a referral sent over to be seen. Called and lvm for pt to let her know she can reach out to her PCP to see if they can assist in her referral   "

## 2024-08-13 ENCOUNTER — Institutional Professional Consult (permissible substitution)
Admit: 2024-08-13 | Discharge: 2024-08-13 | Payer: MEDICARE | Attending: Obstetrics & Gynecology | Primary: Family Medicine

## 2024-08-13 NOTE — Progress Notes (Signed)
 "Ashley Hogan (DOB:  09-Sep-1951) is a 73 y.o. female, is a new patient who presents for evaluation of the following chief complaint(s):  New Patient (prolapse)      PCP: Krystal Dozier NOVAK, MD   Referring provider: Nada Sailors, Aprn - Np  [address]  [place],  [place] [address]     Assessment & Plan   ASSESSMENT/PLAN:  1. Pelvic pressure in female  -     US  NON OB TRANSVAGINAL; Future  2. Cystocele with rectocele  Assessment & Plan:   The diagnosis of pelvic organ prolapse and potential etiologies was also reviewed.  Taken into consideration her clinical history and examination findings recommendation was given for patient to institute appropriate limited to lifestyle behavior modifications that limit pressure in the pelvis such as avoidance of chronic Valsalva, constipation, heavy lifting, and maintenance of weight in the optimal BMI range.  Also discussed avoidance of tobacco use and other habits that may impact collagen and connective tissue.  Additional treatment for pelvic organ prolapse to include clinical observation, pelvic floor physical therapy, use of pessary, and potential surgical options was reviewed.  After counseling she elected to proceed with the following:  Pelvic ultrasound to assess uterus and adnexa  Literature shared regarding treatment options to include use of pessary versus surgical management.    Considering anterior and posterior native tissue repair pending pelvic ultrasound          Return in about 2 weeks (around 08/27/2024) for Prolapse evaluation.       Subjective   SUBJECTIVE/OBJECTIVE:    HPI  Ashley Hogan presents for evaluation of prolapse.  She is referred by Dr Nada. She reports onset of her symptoms 2 weeks  Her current symptoms include discomfort and vaginal bulge.  She denies urinary symptoms at this time of urgency but does have some difficulty initiating urine stream and prolapses most prominent.  Denies constipation.  She was seen in emergency department and  diagnosed with prolapsed vagina.     She reports that her Prolapse significantly impacts her quality of life and physical activities.  Her goals at this time include wants to talk about surgical proceures    The following is a summary of her urogynecologic symptoms assessment and examination:    Prolapse/Bulge Symptoms:yes   Previous treatment for prolapse symptoms: no    Urinary Symptoms/Diary:  Stress incontinence: no  Urgency: yes -  Urge incontinence: no  Frequency: going every 3-5  hours during the day   Nocturia (>2 voids per night): no  Pad/Liner/Brief use:3  Recurrent UTI (>2 UTI / 6 months or > 3 UTI / 1 year)? no  Pain with urination: no  Post void dribbling: yes   Previous treatment for urinary symptoms: no  Medications: No  Procedures: No          Bowel Symptoms:  Patient has a bowel movement every other day.  Constipation: yes - stool softner  Diarrhea: no  Accidental bowel leakage: no  Splinting: no  Previous treatment for bowel symptoms: no      Sexual History:  Sexual Activity: yes - men   Dyspareunia: no  Obstetrics/Gynecologic History:  G3P2  History of vaginal delivery? yes - 1      Past Medical History:  Past Medical History:   Diagnosis Date    Anxiety     Carotid stenosis     carotid stenosis less than 50% on carotid dopplers done 10/2020    Cold sore  Encounter for screening colonoscopy 04/2021    [facility]-planned colonoscopy 04/28/2021-diverticula/internal hemorrhoids, o/w unremarkable. recheck 5 years per [facility]    H/O magnetic resonance imaging of brain and brain stem 12/01/2022    Whole brain imaging shows no acute abnormality. 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 Fluid level in the right maxillary sinus, nonspecific but could reflect acute  sinusitis in the appropriate clinical setting.    Hearing aid worn     Hearing loss     hearing aides for tinnitus/hearing loss    High cholesterol     Hypertension     Memory loss 05/30/2023    neuropsych testing with Dr.  Benigno for etiology.  some mild impairment noted, but nothing overtly diagnosable.    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    osteopenia at hip with t-1.9, normal at spine    Patella fracture     Screening mammogram for breast cancer 05/23/2024    benign    Tinnitus         Surgical History:  Past Surgical History:   Procedure Laterality Date    APPENDECTOMY      BREAST ENHANCEMENT SURGERY Bilateral 09/30/2021    BREAST ENHANCEMENT SURGERY Bilateral 1985    BREAST IMPLANT REMOVAL Bilateral 10/02/2019    CESAREAN SECTION      FEMUR FRACTURE SURGERY Right 2001    [facility] hospital in [place]        Family History of:  Family History   Problem Relation Age of Onset    Stroke Mother     Alcohol Abuse Father     Other Brother         non lymph hodgkins    Breast Cancer Maternal Cousin         Current Outpatient Medications on file  Current Outpatient Medications   Medication Sig Dispense Refill    lisinopril  (PRINIVIL ;ZESTRIL ) 10 MG tablet Take 1 tablet by mouth in the morning and at bedtime 180 tablet 3    FLUoxetine  (PROZAC ) 10 MG capsule Take 1 capsule by mouth daily 90 capsule 3    rosuvastatin  (CRESTOR ) 5 MG tablet One po 2x/week on Wednesday and Sundays for cholesterol 30 tablet 5    calcium -mag-vit D ER (CITRACAL CALCIUM  +D3) 600-40-500 MG-MG-UNIT TB24 Take 1 tablet by mouth 2 times daily For bone density/osteopenia 180 tablet 3    valACYclovir  (VALTREX ) 1 g tablet Take 1 tablet by mouth 2 times daily 60 tablet 1    chlorthalidone  (HYGROTON ) 25 MG tablet Take 1 tablet by mouth daily For bp 30 tablet 5     No current facility-administered medications for this visit.        Allergies:  No Known Allergies     Social History:  Social History     Socioeconomic History    Marital status: Divorced     Spouse name: Not on file    Number of children: Not on file    Years of education: Not on file    Highest education level: Not on file   Occupational History    Not on file   Tobacco Use    Smoking status:  Never    Smokeless tobacco: Never   Vaping Use    Vaping status: Never Used   Substance and Sexual Activity    Alcohol use: Yes     Alcohol/week: 2.0 standard drinks of alcohol     Types: 2 Glasses of wine per week     Comment: 2-4 Times  a month    Drug use: Never    Sexual activity: Yes     Partners: Male   Other Topics Concern    Not on file   Social History Narrative    Not on file     Social Drivers of Health     Financial Resource Strain: Not on file   Food Insecurity: Not on file   Transportation Needs: Not on file   Physical Activity: Sufficiently Active (05/07/2024)    Exercise Vital Sign     Days of Exercise per Week: 7 days     Minutes of Exercise per Session: 90 min   Stress: Not on file   Social Connections: Not on file   Intimate Partner Violence: Not on file   Housing Stability: Not on file        Review of Systems       Objective     PHYSICAL EXAM  Patient gave verbal consent and chaperone present for physical exam.     Vitals:    08/13/24 1523   Weight: 80.3 kg (177 lb)      Body mass index is 27.72 kg/m.     General: No acute distress, pleasant affect  Head/Ears/Nose/Throat: Normocephalic, atraumatic  Skin: No obvious lesions, rashes, or bruises  Lungs: non-labored respirations  Cardiovascular: Warm and well perfused  Neurological: alert and responsive, able to ambulate and follow commands   Abdomen: soft, non-tender, no masses, normal bowel sounds     Pelvic Examination:  Vulva and labia: Normal vulva Sensation: within normal limits  Vagina: Atrophic:  moderate  Cervix: Normal  Urethra: Mobile    Bimanual:Normal  Pelvic floor muscle function ([place]): 2     Rectal Examination:  Rectal: No external lesions or hemorrhoids  PB intact     Myofascial Pelvic Pain Assessment:  Levator ani: No  Obturator internus: No  Periurethral pain: No    POP-Q Examination:  Aa 0 Ba +1 C -8   GH  5 PB   4 TVL 10   Ap-2 Bp -2 D -8     Procedures/Test:   PVR 0  Cough stress test: Negative    ASSESSMENT/PLAN:  1. Pelvic  pressure in female  -     US  NON OB TRANSVAGINAL; Future  2. Cystocele with rectocele  Assessment & Plan:   The diagnosis of pelvic organ prolapse and potential etiologies was also reviewed.  Taken into consideration her clinical history and examination findings recommendation was given for patient to institute appropriate limited to lifestyle behavior modifications that limit pressure in the pelvis such as avoidance of chronic Valsalva, constipation, heavy lifting, and maintenance of weight in the optimal BMI range.  Also discussed avoidance of tobacco use and other habits that may impact collagen and connective tissue.  Additional treatment for pelvic organ prolapse to include clinical observation, pelvic floor physical therapy, use of pessary, and potential surgical options was reviewed.  After counseling she elected to proceed with the following:  Pelvic ultrasound to assess uterus and adnexa  Literature shared regarding treatment options to include use of pessary versus surgical management.    Considering anterior and posterior native tissue repair pending pelvic ultrasound          Return in about 2 weeks (around 08/27/2024) for Prolapse evaluation.         On this date 08/13/2024 I have spent 60 minutes reviewing previous notes, test results and face to face with the patient discussing the diagnosis and  importance of compliance with the treatment plan as well as documenting on the day of the visit.      An electronic signature was used to authenticate this note.    --Elna KATHEE Birmingham, MD   "

## 2024-08-13 NOTE — Assessment & Plan Note (Signed)
"   The diagnosis of pelvic organ prolapse and potential etiologies was also reviewed.  Taken into consideration her clinical history and examination findings recommendation was given for patient to institute appropriate limited to lifestyle behavior modifications that limit pressure in the pelvis such as avoidance of chronic Valsalva, constipation, heavy lifting, and maintenance of weight in the optimal BMI range.  Also discussed avoidance of tobacco use and other habits that may impact collagen and connective tissue.  Additional treatment for pelvic organ prolapse to include clinical observation, pelvic floor physical therapy, use of pessary, and potential surgical options was reviewed.  After counseling she elected to proceed with the following:  Pelvic ultrasound to assess uterus and adnexa  Literature shared regarding treatment options to include use of pessary versus surgical management.    Considering anterior and posterior native tissue repair pending pelvic ultrasound      "

## 2024-08-14 ENCOUNTER — Inpatient Hospital Stay: Admit: 2024-08-14 | Payer: MEDICARE | Attending: Obstetrics & Gynecology | Primary: Family Medicine

## 2024-08-14 DIAGNOSIS — R102 Pelvic and perineal pain unspecified side: Secondary | ICD-10-CM

## 2024-08-14 NOTE — Telephone Encounter (Signed)
"  Spoke with pt informed pt that she would have to speak to radiology to  get pictures from her scans. She stated she will wait unil her appointment with Dr Waddell  "

## 2024-08-14 NOTE — Telephone Encounter (Signed)
"  Patient would like copies pictures of the u/s she had done to today if possible. Patient would like to know how she can go about getting them. This u/s was ordered by Dr. Waddell.   "

## 2024-08-14 NOTE — Telephone Encounter (Signed)
"  Pt said that she got her US  done and wanting to know if Dr. Waddell received the results yet. 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 Pt wanting to know if this is a shock. Please assist.   "

## 2024-08-15 ENCOUNTER — Ambulatory Visit: Admit: 2024-08-15 | Discharge: 2024-08-15 | Payer: MEDICARE | Attending: Family Medicine | Primary: Family Medicine

## 2024-08-15 DIAGNOSIS — I10 Essential (primary) hypertension: Principal | ICD-10-CM

## 2024-08-15 MED ORDER — CHLORTHALIDONE 25 MG PO TABS
25 | ORAL_TABLET | Freq: Every day | ORAL | 5 refills | 90.00000 days | Status: AC
Start: 2024-08-15 — End: ?

## 2024-08-15 MED ORDER — ESTRADIOL 0.01 % VA CREA
0.01 | VAGINAL | 1 refills | 84.00000 days | Status: AC
Start: 2024-08-15 — End: ?

## 2024-08-15 MED ORDER — FLUOXETINE HCL 10 MG PO CAPS
10 | ORAL_CAPSULE | Freq: Every day | ORAL | 3 refills | 90.00000 days | Status: AC
Start: 2024-08-15 — End: ?

## 2024-08-15 NOTE — Progress Notes (Signed)
 "   Patient ID: Ashley Hogan is a 73 y.o. female.  Chief Complaint   Patient presents with    Follow-up      Here for f/u on issues...  Recent issue with having uterine/vaginal prolapse and being looked at and worked up with Dr. Waddell now.  Causing her pressure and up/down at night she says.  Hard to have bm at moment.    Pt with hx of htn, chol-with statin intolerance, heartburn, mild memory loss, osteopenia, anxiety and hsv namely  Reviewed as prior...  Here for f/u  Using lisinopril  at bedtime and chlorthalidone  at bedtime and seems to tolerate better she notes.  Bp at home runs better, reviewed, today was stressed, hadn't slept and more traffic troubles  Still taking rosuvastatin  at bedtime as well as able-not daily.   Has days with her memory but overall doing ok.   On fluoxetine  prior for anxiety and doing well overall  Followed by ortho-Dr. Deronda but released to normal activity recently.  Memory-per Dr. Alfonza, seen recently. Had neuropsych testing as well and thought some anxiety may be part of this and given some trazodone which has helped her anxiety and rest somewhat overall    Imm-discussed, declines flu, covid, rsv      Past Medical History:   Diagnosis Date    Anxiety     Carotid stenosis     carotid stenosis less than 50% on carotid dopplers done 10/2020    Cold sore     Encounter for screening colonoscopy 04/2021    GI-Gourdin-planned colonoscopy 04/28/2021-diverticula/internal hemorrhoids, o/w unremarkable. recheck 5 years per Gourdin    H/O magnetic resonance imaging of brain and brain stem 12/01/2022    Whole brain imaging shows no acute abnormality. 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 Fluid level in the right maxillary sinus, nonspecific but could reflect acute  sinusitis in the appropriate clinical setting.    Hearing aid worn     Hearing loss     hearing aides for tinnitus/hearing loss    High cholesterol     Hypertension     Memory loss 05/30/2023    neuropsych  testing with Dr. Benigno for etiology.  some mild impairment noted, but nothing overtly diagnosable.    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    osteopenia at hip with t-1.9, normal at spine    Patella fracture     Screening mammogram for breast cancer 05/23/2024    benign    Tinnitus       Past Surgical History:   Procedure Laterality Date    APPENDECTOMY      BREAST ENHANCEMENT SURGERY Bilateral 09/30/2021    BREAST ENHANCEMENT SURGERY Bilateral 1985    BREAST IMPLANT REMOVAL Bilateral 10/02/2019    CESAREAN SECTION      FEMUR FRACTURE SURGERY Right 2001    [facility] hospital in [place]    TONSILLECTOMY        Family History   Problem Relation Age of Onset    Stroke Mother     Alcohol Abuse Father     Other Brother         non lymph hodgkins    Breast Cancer Maternal Cousin       No Known Allergies   Social History     Tobacco Use    Smoking status: Never    Smokeless tobacco: Never   Vaping Use    Vaping status: Never Used   Substance Use Topics  Alcohol use: Yes     Alcohol/week: 2.0 standard drinks of alcohol     Types: 2 Glasses of wine per week     Comment: 2-4 Times a month    Drug use: Never      Review of Systems   Constitutional:  Positive for fatigue. Negative for chills and fever.   Respiratory:  Negative for cough and shortness of breath.    Cardiovascular:  Negative for chest pain, palpitations and leg swelling.   Gastrointestinal:  Negative for abdominal pain, nausea and vomiting.   Neurological:  Negative for weakness.   Psychiatric/Behavioral:  Positive for decreased concentration and sleep disturbance.      Patient Care Team:  Krystal Ashley NOVAK, MD as PCP - General  Krystal Ashley NOVAK, MD as PCP - Empaneled Provider  Gourdin, Ricardo Mango, MD as Surgeon (Gastroenterology)  Abbe, Dallas CROME, MD as Consulting Physician (Otolaryngology)  Vinita Debby Slocumb, MD (Neurology)  Crantford, Norleen Ina, MD as Surgeon (Plastic Surgery)  Ashwander, Elsie RAMAN, MD as Consulting Physician  (Vascular Surgery)  Idella Bouchard, MD as Consulting Physician (Obstetrics & Gynecology)  Alfonza Lint, DO (Neurology)  Dolores Miquel SHAUNNA MADISON, MD as Consulting Physician (Orthopedic Surgery)  Marelyn Mini, PhD (Neuropsychology)  Waddell Elna NOVAK, MD (Urogynecology)   Current Outpatient Medications   Medication Sig Dispense Refill    traZODone (DESYREL) 50 MG tablet Take 1 tablet by mouth nightly      estradiol  (ESTRACE ) 0.01 % vaginal cream 2 grams pv daily x 2 weeks, then try every other day thereafter for help 1 each 1    chlorthalidone  (HYGROTON ) 25 MG tablet Take 1 tablet by mouth daily For bp 30 tablet 5    FLUoxetine  (PROZAC ) 10 MG capsule Take 1 capsule by mouth daily 90 capsule 3    lisinopril  (PRINIVIL ;ZESTRIL ) 10 MG tablet Take 1 tablet by mouth in the morning and at bedtime 180 tablet 3    rosuvastatin  (CRESTOR ) 5 MG tablet One po 2x/week on Wednesday and Sundays for cholesterol 30 tablet 5    calcium -mag-vit D ER (CITRACAL CALCIUM  +D3) 600-40-500 MG-MG-UNIT TB24 Take 1 tablet by mouth 2 times daily For bone density/osteopenia 180 tablet 3    valACYclovir  (VALTREX ) 1 g tablet Take 1 tablet by mouth 2 times daily 60 tablet 1     No current facility-administered medications for this visit.      PHQ-9 Total Score: 7 (08/15/2024 10:36 AM)  Thoughts that you would be better off dead, or of hurting yourself in some way: 0 (08/15/2024 10:36 AM)     Objective:     Lab Results   Component Value Date/Time    TSH 1.700 11/16/2022 12:45 PM       BP 125/70 (home bp reading) (08/15/24 1053)    Temp      Pulse 64 (08/15/24 1033)   Resp 16 (08/15/24 1033)    SpO2 99 % (08/15/24 1033)     Body mass index is 27.72 kg/m.  Lab Results   Component Value Date    WBC 6.2 05/15/2024    HGB 12.8 05/15/2024    HCT 39.5 05/15/2024    MCV 95.9 05/15/2024    PLT 275 05/15/2024     Lab Results   Component Value Date    NA 137 05/15/2024    K 3.8 05/15/2024    CL 99 05/15/2024    CO2 26 05/15/2024    BUN 27 (H) 05/15/2024    CREATININE  1.2 (H)  05/15/2024    GLUCOSE 101 (H) 05/15/2024    CALCIUM  9.6 05/15/2024    BILITOT 0.35 05/15/2024    ALKPHOS 53 05/15/2024    AST 23 05/15/2024    ALT 16 05/15/2024    LABGLOM 48 (L) 05/15/2024    GLOB 2.3 05/15/2024     Hemoglobin A1C   Date Value Ref Range Status   05/15/2024 5.6 4.0 - 6.0 % Final     Comment:     HEMOGLOBIN A1C INTERPRETATION:    The following arbitrary ranges may be used for interpretation of the results.  However, factors such as duration of diabetes, adherence to therapy, and  patient age should also be considered in assessing degree of blood glucose  control.    Hemoglobin A1C                 Avg. Blood Sugar  --------------------------------------------------------------  6%                           135 mg/dL  7%                           170 mg/dL  8%                           205 mg/dL  9%                           240 mg/dL  89%                          275 mg/dL    ======================================================    A1C                      Glucose Control  ----------------------------------------------------------------  < 6.0 %                   Normal  6.0 - 6.9 %               Abnormal  7.0 - 7.9 %               Sub-Optimal Control  > 8.0 %                   Inadequate Control       Lab Results   Component Value Date    CHOL 283 (H) 05/15/2024    CHOL 299 (H) 04/26/2023    CHOL 277 (H) 04/19/2022     Lab Results   Component Value Date    TRIG 188 (H) 05/15/2024    TRIG 159 (H) 04/26/2023    TRIG 171 (H) 04/19/2022     Lab Results   Component Value Date    HDL 59 05/15/2024    HDL 70 04/26/2023    HDL 64 04/19/2022     No components found for: LDLCHOLESTEROL, LDLCALC  Lab Results   Component Value Date    VLDL 37.6 05/15/2024    VLDL 31.8 04/26/2023    VLDL 34.2 04/19/2022     Lab Results   Component Value Date    CHOLHDLRATIO 4.8 (H) 05/15/2024    CHOLHDLRATIO 4.3 04/26/2023    CHOLHDLRATIO 4.3 04/19/2022     Lab Results   Component Value Date    TSH 1.700  11/16/2022      No results found for: PSA    Physical Exam  Constitutional:       General: She is not in acute distress.     Appearance: Normal appearance. She is not ill-appearing or toxic-appearing.   HENT:      Head: Normocephalic and atraumatic.      Right Ear: External ear normal.      Left Ear: External ear normal.   Eyes:      Extraocular Movements: Extraocular movements intact.      Conjunctiva/sclera: Conjunctivae normal.   Neck:      Vascular: No carotid bruit.   Cardiovascular:      Rate and Rhythm: Normal rate and regular rhythm.      Pulses: Normal pulses.      Heart sounds: Normal heart sounds. No murmur heard.  Pulmonary:      Effort: Pulmonary effort is normal. No respiratory distress.      Breath sounds: Normal breath sounds. No wheezing.   Abdominal:      General: Bowel sounds are normal. There is no distension.      Palpations: Abdomen is soft.      Tenderness: There is no abdominal tenderness.   Neurological:      General: No focal deficit present.      Mental Status: She is alert and oriented to person, place, and time. Mental status is at baseline.   Psychiatric:         Mood and Affect: Mood normal.         Behavior: Behavior normal.       No results found for this visit on 08/15/24.   Assessment:      Encounter Diagnoses   Name Primary?    Primary hypertension Yes    Pure hypercholesterolemia     Memory loss     Bilateral hearing loss, unspecified hearing loss type     Osteopenia after menopause     HSV (herpes simplex virus) infection     Uterine prolapse     Anxiety     Stress           Plan:   Assessment & Plan   Htn-Pt doing well on current medications, tolerating w/o sig side effects. Refilled meds if needed at this time. Reviewed briefly Dash diet and exercise/stress reduction techniques if applicable get labs soon to make sure doing better and tolerable, will check and let us  know.  Cholesterol-Pt tolerating current medications w/o sig side effects.  Reviewed lipid panel history.  Continue  current medications-refill if needed.  Have advised mediterranean style diet as helpful for cholesterol and longevity generally. Labs soon.  Osteopenia-recent knee fx with fall, work on ca/vit d and wt bearing exercise and consider further in future, maybe even low dose fosamax to help  Hsv-has valtrex , stable, continue prn  Mood-stable, continue fluoxetine  as doing.  Uterine prolapse-per Dr. Waddell with review upcoming again soon. Will try some estrace  cream now-discussed pros/cons. Sent to help in meantime.  Memory/MCI-some better with trazodone, will consider strattera or the like in future if seems to worsen  Imm-discussed/declines now  I confirm that I have managed the broad scope of the patient's health needs by furnishing care for some or all of the patient's acute and/or chronic conditions across a spectrum of diagnoses and organ systems that will require ongoing care with myself or someone on my team.    Rtc for awv in August, earlier prn issues  Orders  Placed This Encounter    traZODone (DESYREL) 50 MG tablet     Sig: Take 1 tablet by mouth nightly    estradiol  (ESTRACE ) 0.01 % vaginal cream     Sig: 2 grams pv daily x 2 weeks, then try every other day thereafter for help     Dispense:  1 each     Refill:  1    chlorthalidone  (HYGROTON ) 25 MG tablet     Sig: Take 1 tablet by mouth daily For bp     Dispense:  30 tablet     Refill:  5    FLUoxetine  (PROZAC ) 10 MG capsule     Sig: Take 1 capsule by mouth daily     Dispense:  90 capsule     Refill:  3            Ashley KATHEE Boas, MD  "

## 2024-08-27 ENCOUNTER — Ambulatory Visit
Admit: 2024-08-27 | Discharge: 2024-08-27 | Payer: MEDICARE | Attending: Obstetrics & Gynecology | Primary: Family Medicine

## 2024-08-27 DIAGNOSIS — N8110 Cystocele, unspecified: Principal | ICD-10-CM

## 2024-08-27 NOTE — Telephone Encounter (Signed)
"-----   Message from Dr. Elna Birmingham, MD sent at 08/27/2024 10:51 AM EST -----  Regarding: surgical posting    [image]  Dear [name],       Ashley Hogan (DOB:  [birth year]) is a 73 y.o. female    DIAGNOSIS: Cystocele with rectocele    PROCEDURE: List of Urogynecology Procedures: Vaginal Repair Anterior and Posterior [id number]    PLACE OF SERVICE: PLACE OF SERVICE/LOCATION: [facility]    LENGTH OF STAY: Outpatient    POSITIONING:Lithotomy     ANESTHESIA: General    SPECIAL INSTRUCTIONS: None    ASSISTANT SURGEON: APP    LENGTH OF SURGERY: 30 min      PREOP VISIT: APP- Reveal PA    PREOP VISIT LOCATION: Urogynecology Office Location: [name] - [address] [place], [address] [place], [place] [address]      POSTOP VISIT: within 1 week or 4 weeks with MD     POSTOP VISIT LOCATION: [name] - [address] [place], [address] [place], [place] [address]        Elna Calandra) Birmingham, MD, [facility]  [facility]  [address] [place] [address]  [place], [place]  [address]  Office: [phone]  "

## 2024-08-27 NOTE — Progress Notes (Signed)
 "  Ashley Hogan (DOB:  1951/06/21) is a 74 y.o. female,Established patient, here for evaluation of the following chief complaint(s):  Follow-up (Pelvic Prolapse/pressure)      PCP: Krystal Dozier NOVAK, MD   Referring provider: No referring provider defined for this encounter.     ASSESSMENT/PLAN:  1. Cystocele with rectocele  Assessment & Plan:   Reviewed treatment options alternatives symptomatic prolapse.  After counseling she elects proceed with surgery recommend anterior and posterior repair with uterine preservation  Follow-up preop counseled  Literature anterior and posterior colporrhaphy as well as preoperative planning    Return awaiting surgery date, for Surgical preop visit.    SUBJECTIVE/OBJECTIVE:  Ashley Hogan presents for a follow up visit for pelvic organ prolapse.   She was last seen on 08/13/2024 and reports her symptoms are not changed.   In the interim she reports: Reviewed pelvic ultrasound and findings. Overall reassuring with endometrium <51mm without bleeding associated with less than 1% risk of cancer.     08/14/2024  FINDINGS:  Uterus: Measures 6.9 x 3.4 x 3.1 cm. Heterogeneous appearance of the myometrium   partially obscures the endometrium and makes it difficult to measure but the   endometrium does appear thickened measuring up to 0.7 cm. 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     Right ovary: Not visualized     Left ovary: Not visualized     No significant free fluid within the pelvis.      IMPRESSION:     Endometrium is difficult to visualize but does appear thickened measuring up to   0.7 cm. This would be considered abnormal in the setting of postmenopausal   bleeding. Recommend gynecologic consultation and consideration of endometrial   biopsy as indicated.     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     The bilateral ovaries are not visualized.    Discussed treatment options regarding prolapse. Conservative options vs surgery were reviewed.  Discussed risk benefits of surgery as  well as perioperative expectations and convalescence.  Recommended native tissue anterior and posterior pair with uterine preservation based on reassuring ultrasound.    Past Medical History:  Past Medical History:   Diagnosis Date    Anxiety     Carotid stenosis     carotid stenosis less than 50% on carotid dopplers done 10/2020    Cold sore     Encounter for screening colonoscopy 04/2021    GI-Gourdin-planned colonoscopy 04/28/2021-diverticula/internal hemorrhoids, o/w unremarkable. recheck 5 years per Gourdin    H/O magnetic resonance imaging of brain and brain stem 12/01/2022    Whole brain imaging shows no acute abnormality. 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 Fluid level in the right maxillary sinus, nonspecific but could reflect acute  sinusitis in the appropriate clinical setting.    Hearing aid worn     Hearing loss     hearing aides for tinnitus/hearing loss    High cholesterol     Hypertension     Memory loss 05/30/2023    neuropsych testing with Dr. Benigno for etiology.  some mild impairment noted, but nothing overtly diagnosable.    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    osteopenia at hip with t-1.9, normal at spine    Patella fracture     Screening mammogram for breast cancer 05/23/2024    benign    Tinnitus         Surgical History:  Past Surgical History:   Procedure Laterality Date    APPENDECTOMY  BREAST ENHANCEMENT SURGERY Bilateral 09/30/2021    BREAST ENHANCEMENT SURGERY Bilateral 1985    BREAST IMPLANT REMOVAL Bilateral 10/02/2019    CESAREAN SECTION      FEMUR FRACTURE SURGERY Right 2001    mission hospital in [place]    TONSILLECTOMY          Family History of:  Family History   Problem Relation Age of Onset    Stroke Mother     Alcohol Abuse Father     Other Brother         non lymph hodgkins    Breast Cancer Maternal Cousin         Current Outpatient Medications on file  Current Outpatient Medications   Medication Sig Dispense Refill    traZODone  (DESYREL) 50 MG tablet Take 1 tablet by mouth nightly      estradiol  (ESTRACE ) 0.01 % vaginal cream 2 grams pv daily x 2 weeks, then try every other day thereafter for help 1 each 1    chlorthalidone  (HYGROTON ) 25 MG tablet Take 1 tablet by mouth daily For bp 30 tablet 5    FLUoxetine  (PROZAC ) 10 MG capsule Take 1 capsule by mouth daily 90 capsule 3    lisinopril  (PRINIVIL ;ZESTRIL ) 10 MG tablet Take 1 tablet by mouth in the morning and at bedtime 180 tablet 3    rosuvastatin  (CRESTOR ) 5 MG tablet One po 2x/week on Wednesday and Sundays for cholesterol 30 tablet 5    calcium -mag-vit D ER (CITRACAL CALCIUM  +D3) 600-40-500 MG-MG-UNIT TB24 Take 1 tablet by mouth 2 times daily For bone density/osteopenia 180 tablet 3    valACYclovir  (VALTREX ) 1 g tablet Take 1 tablet by mouth 2 times daily 60 tablet 1     No current facility-administered medications for this visit.        Allergies:  No Known Allergies     Social History:  Social History     Socioeconomic History    Marital status: Divorced     Spouse name: Not on file    Number of children: Not on file    Years of education: Not on file    Highest education level: Not on file   Occupational History    Not on file   Tobacco Use    Smoking status: Never    Smokeless tobacco: Never   Vaping Use    Vaping status: Never Used   Substance and Sexual Activity    Alcohol use: Yes     Alcohol/week: 2.0 standard drinks of alcohol     Types: 2 Glasses of wine per week     Comment: 2-4 Times a month    Drug use: Never    Sexual activity: Yes     Partners: Male   Other Topics Concern    Not on file   Social History Narrative    Not on file     Social Drivers of Health     Financial Resource Strain: Not on file   Food Insecurity: Not on file   Transportation Needs: Not on file   Physical Activity: Sufficiently Active (05/07/2024)    Exercise Vital Sign     Days of Exercise per Week: 7 days     Minutes of Exercise per Session: 90 min   Stress: Not on file   Social Connections: Not on  file   Intimate Partner Violence: Not on file   Housing Stability: Not on file        Review of Systems  Objective   There were no vitals taken for this visit.     Physical Exam  Constitutional:       Appearance: Normal appearance.   HENT:      Head: Normocephalic.   Eyes:      Conjunctiva/sclera: Conjunctivae normal.   Pulmonary:      Effort: Pulmonary effort is normal.   Skin:     General: Skin is warm and dry.   Neurological:      General: No focal deficit present.      Mental Status: She is alert.         Labs/Procedures:    On this date 08/27/2024 I have spent 25 minutes reviewing previous notes, test results and face to face with the patient discussing the diagnosis and importance of compliance with the treatment plan as well as documenting on the day of the visit.      An electronic signature was used to authenticate this note.    --Elna KATHEE Birmingham, MD   "

## 2024-08-27 NOTE — Telephone Encounter (Signed)
"  Spoke with patient she is scheduled for surgery with Dr. Waddell on 11/24/24 at 8:30 a.m. with an arrival time of 6:30 a.m. to [facility]    Pre-op 11/12/24 at 1:00 WA PA  Post-op 12/17/24 at 11:15 WA MD  "

## 2024-08-27 NOTE — Assessment & Plan Note (Signed)
"   Reviewed treatment options alternatives symptomatic prolapse.  After counseling she elects proceed with surgery recommend anterior and posterior repair with uterine preservation  Follow-up preop counseled  Literature anterior and posterior colporrhaphy as well as preoperative planning  "

## 2024-08-27 NOTE — Telephone Encounter (Signed)
"  Left voicemail for patient to return call to schedule surgery with Dr. Waddell.   "

## 2024-09-22 ENCOUNTER — Ambulatory Visit: Admit: 2024-09-22 | Discharge: 2024-09-22 | Payer: MEDICARE | Attending: Family Medicine | Primary: Family Medicine

## 2024-09-22 VITALS — BP 147/77 | HR 62 | Temp 98.20000°F | Wt 175.0 lb

## 2024-09-22 DIAGNOSIS — J101 Influenza due to other identified influenza virus with other respiratory manifestations: Principal | ICD-10-CM

## 2024-09-22 MED ORDER — BENZONATATE 200 MG PO CAPS
200 | ORAL_CAPSULE | Freq: Three times a day (TID) | ORAL | 0 refills | 7.00000 days | Status: AC | PRN
Start: 2024-09-22 — End: 2024-10-02

## 2024-09-22 MED ORDER — OSELTAMIVIR PHOSPHATE 75 MG PO CAPS
75 | ORAL_CAPSULE | Freq: Two times a day (BID) | ORAL | 0 refills | 5.00000 days | Status: AC
Start: 2024-09-22 — End: 2024-09-27

## 2024-09-22 MED ORDER — PSEUDOEPH-BROMPHEN-DM 30-2-10 MG/5ML PO SYRP
2-30-10 | Freq: Four times a day (QID) | ORAL | 1 refills | 5.50000 days | Status: AC | PRN
Start: 2024-09-22 — End: ?

## 2024-09-22 NOTE — Progress Notes (Signed)
 "   Patient ID: Ashley Hogan is a 73 y.o. female.  Chief Complaint   Patient presents with    Cough    Congestion    Generalized Body Aches    Headache    Nasal Congestion     Took a flu cam pos for flu a      Pt with hx of htn, chol, anxiety namely, here today for cough/congestion, aches and headache since yesterday mainly-maybe Saturday evening.  Using otc meds with mild improvement.  Very tired.  Did at home covid/flu test and positive for flu a which has been very widespread in community lately epidemiologically. No flu shot this year.  Had planned to leave town but now discussed, will stay home for christmas and wait until better to visit any family    Cough  Associated symptoms include headaches. Pertinent negatives include no wheezing.   Generalized Body Aches  Associated symptoms include congestion, coughing and headaches.   Headache    Past Medical History:   Diagnosis Date    Anxiety     Carotid stenosis     carotid stenosis less than 50% on carotid dopplers done 10/2020    Cold sore     Encounter for screening colonoscopy 04/2021    GI-[facility]-planned colonoscopy 04/28/2021-diverticula/internal hemorrhoids, o/w unremarkable. recheck 5 years per Gourdin    H/O magnetic resonance imaging of brain and brain stem 12/01/2022    Whole brain imaging shows no acute abnormality. 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 Fluid level in the right maxillary sinus, nonspecific but could reflect acute  sinusitis in the appropriate clinical setting.    Hearing aid worn     Hearing loss     hearing aides for tinnitus/hearing loss    High cholesterol     Hypertension     Memory loss 05/30/2023    neuropsych testing with Dr. Benigno for etiology.  some mild impairment noted, but nothing overtly diagnosable.    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    osteopenia at hip with t-1.9, normal at spine    Patella fracture     Screening mammogram for breast cancer 05/23/2024    benign    Tinnitus        Past Surgical History:   Procedure Laterality Date    APPENDECTOMY      BREAST ENHANCEMENT SURGERY Bilateral 09/30/2021    BREAST ENHANCEMENT SURGERY Bilateral 1985    BREAST IMPLANT REMOVAL Bilateral 10/02/2019    CESAREAN SECTION      FEMUR FRACTURE SURGERY Right 2001    [facility] in [place]    TONSILLECTOMY        Family History   Problem Relation Age of Onset    Stroke Mother     Alcohol Abuse Father     Other Brother         non lymph hodgkins    Breast Cancer Maternal Cousin       No Known Allergies   Social History     Tobacco Use    Smoking status: Never    Smokeless tobacco: Never   Vaping Use    Vaping status: Never Used   Substance Use Topics    Alcohol use: Yes     Alcohol/week: 2.0 standard drinks of alcohol     Types: 2 Glasses of wine per week     Comment: 2-4 Times a month    Drug use: Never      Review of Systems  HENT:  Positive for congestion.    Respiratory:  Positive for cough. Negative for wheezing.    Neurological:  Positive for headaches.     Patient Care Team:  Krystal Dozier NOVAK, MD as PCP - General  Krystal Dozier NOVAK, MD as PCP - Empaneled Provider  Gourdin, Ricardo Mango, MD as Surgeon (Gastroenterology)  Abbe, Dallas CROME, MD as Consulting Physician (Otolaryngology)  Vinita Debby Slocumb, MD (Neurology)  Crantford, Norleen Ina, MD as Surgeon (Plastic Surgery)  Ashwander, Elsie RAMAN, MD as Consulting Physician (Vascular Surgery)  Idella Bouchard, MD as Consulting Physician (Obstetrics & Gynecology)  Alfonza Lint, DO (Neurology)  Dolores Miquel SHAUNNA MADISON, MD as Consulting Physician (Orthopedic Surgery)  Marelyn Mini, PhD (Neuropsychology)  Waddell Elna NOVAK, MD (Urogynecology)   Current Outpatient Medications   Medication Sig Dispense Refill    benzonatate  (TESSALON ) 200 MG capsule Take 1 capsule by mouth 3 times daily as needed for Cough 30 capsule 0    brompheniramine-pseudoephedrine-DM 2-30-10 MG/5ML syrup Take 5 mLs by mouth 4 times daily as needed for Congestion or Cough 150  mL 1    oseltamivir  (TAMIFLU ) 75 MG capsule Take 1 capsule by mouth 2 times daily for 5 days 10 capsule 0    traZODone (DESYREL) 50 MG tablet Take 1 tablet by mouth nightly      estradiol  (ESTRACE ) 0.01 % vaginal cream 2 grams pv daily x 2 weeks, then try every other day thereafter for help 1 each 1    chlorthalidone  (HYGROTON ) 25 MG tablet Take 1 tablet by mouth daily For bp 30 tablet 5    FLUoxetine  (PROZAC ) 10 MG capsule Take 1 capsule by mouth daily 90 capsule 3    lisinopril  (PRINIVIL ;ZESTRIL ) 10 MG tablet Take 1 tablet by mouth in the morning and at bedtime 180 tablet 3    rosuvastatin  (CRESTOR ) 5 MG tablet One po 2x/week on Wednesday and Sundays for cholesterol 30 tablet 5    calcium -mag-vit D ER (CITRACAL CALCIUM  +D3) 600-40-500 MG-MG-UNIT TB24 Take 1 tablet by mouth 2 times daily For bone density/osteopenia 180 tablet 3    valACYclovir  (VALTREX ) 1 g tablet Take 1 tablet by mouth 2 times daily 60 tablet 1     No current facility-administered medications for this visit.      No data recorded   Objective:     Lab Results   Component Value Date/Time    TSH 1.700 11/16/2022 12:45 PM       BP (!) 147/77 (09/22/24 1113)    Temp 98.2 F (36.8 C) (09/22/24 1113)    Pulse 62 (09/22/24 1113)   Resp      SpO2 98 % (09/22/24 1113)     Body mass index is 27.41 kg/m.  Lab Results   Component Value Date    WBC 6.2 05/15/2024    HGB 12.8 05/15/2024    HCT 39.5 05/15/2024    MCV 95.9 05/15/2024    PLT 275 05/15/2024     Lab Results   Component Value Date    NA 137 05/15/2024    K 3.8 05/15/2024    CL 99 05/15/2024    CO2 26 05/15/2024    BUN 27 (H) 05/15/2024    CREATININE 1.2 (H) 05/15/2024    GLUCOSE 101 (H) 05/15/2024    CALCIUM  9.6 05/15/2024    BILITOT 0.35 05/15/2024    ALKPHOS 53 05/15/2024    AST 23 05/15/2024    ALT 16 05/15/2024    LABGLOM 48 (L) 05/15/2024  GLOB 2.3 05/15/2024     Hemoglobin A1C   Date Value Ref Range Status   05/15/2024 5.6 4.0 - 6.0 % Final     Comment:     HEMOGLOBIN A1C  INTERPRETATION:    The following arbitrary ranges may be used for interpretation of the results.  However, factors such as duration of diabetes, adherence to therapy, and  patient age should also be considered in assessing degree of blood glucose  control.    Hemoglobin A1C                 Avg. Blood Sugar  --------------------------------------------------------------  6%                           135 mg/dL  7%                           170 mg/dL  8%                           205 mg/dL  9%                           240 mg/dL  89%                          275 mg/dL    ======================================================    A1C                      Glucose Control  ----------------------------------------------------------------  < 6.0 %                   Normal  6.0 - 6.9 %               Abnormal  7.0 - 7.9 %               Sub-Optimal Control  > 8.0 %                   Inadequate Control       Lab Results   Component Value Date    CHOL 283 (H) 05/15/2024    CHOL 299 (H) 04/26/2023    CHOL 277 (H) 04/19/2022     Lab Results   Component Value Date    TRIG 188 (H) 05/15/2024    TRIG 159 (H) 04/26/2023    TRIG 171 (H) 04/19/2022     Lab Results   Component Value Date    HDL 59 05/15/2024    HDL 70 04/26/2023    HDL 64 04/19/2022     No components found for: LDLCHOLESTEROL, LDLCALC  Lab Results   Component Value Date    VLDL 37.6 05/15/2024    VLDL 31.8 04/26/2023    VLDL 34.2 04/19/2022     Lab Results   Component Value Date    CHOLHDLRATIO 4.8 (H) 05/15/2024    CHOLHDLRATIO 4.3 04/26/2023    CHOLHDLRATIO 4.3 04/19/2022     Lab Results   Component Value Date    TSH 1.700 11/16/2022     No results found for: PSA    Physical Exam  Constitutional:       General: She is not in acute distress.     Appearance: She is ill-appearing. She is not toxic-appearing.   HENT:  Right Ear: Tympanic membrane and ear canal normal.      Left Ear: Tympanic membrane and ear canal normal.      Nose: Congestion present.       Comments: masked  Eyes:      Conjunctiva/sclera: Conjunctivae normal.   Neck:      Vascular: No carotid bruit.   Pulmonary:      Effort: No respiratory distress.      Breath sounds: No wheezing or rhonchi.      Comments: Coarse cough noted  Abdominal:      General: Bowel sounds are normal.      Palpations: Abdomen is soft.      Tenderness: There is no abdominal tenderness.   Musculoskeletal:      Cervical back: Neck supple.   Lymphadenopathy:      Cervical: No cervical adenopathy.   Skin:     General: Skin is warm.   Neurological:      General: No focal deficit present.      Mental Status: She is oriented to person, place, and time. Mental status is at baseline.   Psychiatric:         Mood and Affect: Mood normal.         Behavior: Behavior normal.         Thought Content: Thought content normal.       No results found for this visit on 09/22/24.   Assessment:      Encounter Diagnoses   Name Primary?    Influenza A Yes    Primary hypertension     Pure hypercholesterolemia     Anxiety           Plan:   Assessment & Plan   URI/sinus/cough-Push fluids as able, elevated head of bed, nasal saline and cool mist humidifer if available to help. Advised can use motrin/tylenol/aleve for aches/pains/fever if needed. Mucinex or mucinex dm can help cough/congestion as needed.  Otc zyrtec, claritin or chlortrimeton can be used for allergy symptoms.  Honey can be used at bedtime for allergy relief and cough suppression.  Symptomatic tx as noted above.  Call if worsening or no better in a week as needed.  Tx as noted with tamiflu  and symptom meds.  Discussed possible pros/cons to use and what to expect at time.  Bp slightly high but likely due to illness  Cholesterol-Pt tolerating current medications w/o sig side effects.  Reviewed lipid panel history.  Continue current medications-refill if needed.  Have advised mediterranean style diet as helpful for cholesterol and longevity generally.   I confirm that I have managed the broad  scope of the patient's health needs by furnishing care for some or all of the patient's acute and/or chronic conditions across a spectrum of diagnoses and organ systems that will require ongoing care with myself or someone on my team.  Rtc prn  Orders Placed This Encounter    benzonatate  (TESSALON ) 200 MG capsule     Sig: Take 1 capsule by mouth 3 times daily as needed for Cough     Dispense:  30 capsule     Refill:  0    brompheniramine-pseudoephedrine-DM 2-30-10 MG/5ML syrup     Sig: Take 5 mLs by mouth 4 times daily as needed for Congestion or Cough     Dispense:  150 mL     Refill:  1    oseltamivir  (TAMIFLU ) 75 MG capsule     Sig: Take 1 capsule by mouth 2 times  daily for 5 days     Dispense:  10 capsule     Refill:  0            Dozier KATHEE Boas, MD  "

## 2024-11-12 ENCOUNTER — Encounter: Payer: MEDICARE | Primary: Family Medicine

## 2024-11-12 NOTE — Progress Notes (Unsigned)
 Ashley Hogan (DOB:  Jan 13, 1951) is a 74 y.o. female,Established patient, here for evaluation of the following chief complaint(s):  No chief complaint on file.      PCP: Krystal Dozier NOVAK, MD   Referring provider: No referring provider defined for this encounter.     ASSESSMENT/PLAN:  {There are no diagnoses linked to this encounter. (Refresh or delete this SmartLink)}  No follow-ups on file.    SUBJECTIVE/OBJECTIVE:  Ashley Hogan presents for a follow up visit for pessary.   She was last seen on 08/27/2024 and reports her symptoms are stable.   In the interim she reports: ***      Past Medical History:  Past Medical History:   Diagnosis Date    Anxiety     Carotid stenosis     carotid stenosis less than 50% on carotid dopplers done 10/2020    Cold sore     Encounter for screening colonoscopy 04/2021    [facility]-planned colonoscopy 04/28/2021-diverticula/internal hemorrhoids, o/w unremarkable. recheck 5 years per Gourdin    H/O magnetic resonance imaging of brain and brain stem 12/01/2022    Whole brain imaging shows no acute abnormality. 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 Fluid level in the right maxillary sinus, nonspecific but could reflect acute  sinusitis in the appropriate clinical setting.    Hearing aid worn     Hearing loss     hearing aides for tinnitus/hearing loss    High cholesterol     Hypertension     Memory loss 05/30/2023    neuropsych testing with Dr. Benigno for etiology.  some mild impairment noted, but nothing overtly diagnosable.    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    osteopenia at hip with t-1.9, normal at spine    Patella fracture     Screening mammogram for breast cancer 05/23/2024    benign    Tinnitus         Surgical History:  Past Surgical History:   Procedure Laterality Date    APPENDECTOMY      BREAST ENHANCEMENT SURGERY Bilateral 09/30/2021    BREAST ENHANCEMENT SURGERY Bilateral 1985    BREAST IMPLANT REMOVAL Bilateral 10/02/2019    CESAREAN  SECTION      FEMUR FRACTURE SURGERY Right 2001    mission hospital in [place]    TONSILLECTOMY          Family History of:  Family History   Problem Relation Age of Onset    Stroke Mother     Alcohol Abuse Father     Other Brother         non lymph hodgkins    Breast Cancer Maternal Cousin         Current Outpatient Medications on file  Current Outpatient Medications   Medication Sig Dispense Refill    brompheniramine-pseudoephedrine-DM 2-30-10 MG/5ML syrup Take 5 mLs by mouth 4 times daily as needed for Congestion or Cough 150 mL 1    traZODone (DESYREL) 50 MG tablet Take 1 tablet by mouth nightly      estradiol  (ESTRACE ) 0.01 % vaginal cream 2 grams pv daily x 2 weeks, then try every other day thereafter for help 1 each 1    chlorthalidone  (HYGROTON ) 25 MG tablet Take 1 tablet by mouth daily For bp 30 tablet 5    FLUoxetine  (PROZAC ) 10 MG capsule Take 1 capsule by mouth daily 90 capsule 3    lisinopril  (PRINIVIL ;ZESTRIL ) 10 MG tablet Take 1 tablet by mouth in the morning  and at bedtime 180 tablet 3    rosuvastatin  (CRESTOR ) 5 MG tablet One po 2x/week on Wednesday and Sundays for cholesterol 30 tablet 5    calcium -mag-vit D ER (CITRACAL CALCIUM  +D3) 600-40-500 MG-MG-UNIT TB24 Take 1 tablet by mouth 2 times daily For bone density/osteopenia 180 tablet 3    valACYclovir  (VALTREX ) 1 g tablet Take 1 tablet by mouth 2 times daily 60 tablet 1     No current facility-administered medications for this visit.        Allergies:  No Known Allergies     Social History:  Social History     Socioeconomic History    Marital status: Divorced     Spouse name: Not on file    Number of children: Not on file    Years of education: Not on file    Highest education level: Not on file   Occupational History    Not on file   Tobacco Use    Smoking status: Never    Smokeless tobacco: Never   Vaping Use    Vaping status: Never Used   Substance and Sexual Activity    Alcohol use: Yes     Alcohol/week: 2.0 standard drinks of alcohol      Types: 2 Glasses of wine per week     Comment: 2-4 Times a month    Drug use: Never    Sexual activity: Yes     Partners: Male   Other Topics Concern    Not on file   Social History Narrative    Not on file     Social Drivers of Health     Financial Resource Strain: Not on file   Food Insecurity: Not on file   Transportation Needs: Not on file   Physical Activity: Sufficiently Active (05/07/2024)    Exercise Vital Sign     Days of Exercise per Week: 7 days     Minutes of Exercise per Session: 90 min   Stress: Not on file   Social Connections: Not on file   Intimate Partner Violence: Not on file   Housing Stability: Not on file             Objective   There were no vitals taken for this visit.     Physical Exam    Labs/Procedures:  No results found for any visits on 11/12/24.    PVR ***    On this date 11/12/2024 I have spent *** minutes reviewing previous notes, test results and face to face with the patient discussing the diagnosis and importance of compliance with the treatment plan as well as documenting on the day of the visit.      An electronic signature was used to authenticate this note.    --Bascom Eddy, MA

## 2024-11-24 ENCOUNTER — Inpatient Hospital Stay: Payer: MEDICARE
